# Patient Record
Sex: Female | Born: 1970 | Race: White | Hispanic: No | Marital: Married | Smoking: Never smoker
Health system: Southern US, Community
[De-identification: ages and names within clinical notes are randomized; demographics above are authoritative.]

## PROBLEM LIST (undated history)

## (undated) DIAGNOSIS — E079 Disorder of thyroid, unspecified: Secondary | ICD-10-CM

## (undated) DIAGNOSIS — K589 Irritable bowel syndrome without diarrhea: Secondary | ICD-10-CM

## (undated) DIAGNOSIS — E559 Vitamin D deficiency, unspecified: Secondary | ICD-10-CM

## (undated) HISTORY — DX: Irritable bowel syndrome, unspecified: K58.9

## (undated) HISTORY — PX: ABDOMINAL HYSTERECTOMY: SHX81

## (undated) HISTORY — DX: Vitamin D deficiency, unspecified: E55.9

---

## 1997-10-28 ENCOUNTER — Other Ambulatory Visit: Admission: RE | Admit: 1997-10-28 | Discharge: 1997-10-28 | Payer: Self-pay | Admitting: Obstetrics and Gynecology

## 1998-12-04 ENCOUNTER — Other Ambulatory Visit: Admission: RE | Admit: 1998-12-04 | Discharge: 1998-12-04 | Payer: Self-pay | Admitting: Obstetrics and Gynecology

## 1999-02-08 ENCOUNTER — Inpatient Hospital Stay (HOSPITAL_COMMUNITY): Admission: AD | Admit: 1999-02-08 | Discharge: 1999-02-08 | Payer: Self-pay | Admitting: *Deleted

## 1999-06-16 ENCOUNTER — Ambulatory Visit: Admission: RE | Admit: 1999-06-16 | Discharge: 1999-06-16 | Payer: Self-pay | Admitting: Obstetrics & Gynecology

## 1999-06-16 ENCOUNTER — Encounter: Payer: Self-pay | Admitting: Obstetrics & Gynecology

## 1999-08-14 ENCOUNTER — Inpatient Hospital Stay (HOSPITAL_COMMUNITY): Admission: AD | Admit: 1999-08-14 | Discharge: 1999-08-16 | Payer: Self-pay | Admitting: Obstetrics & Gynecology

## 1999-09-29 ENCOUNTER — Other Ambulatory Visit: Admission: RE | Admit: 1999-09-29 | Discharge: 1999-09-29 | Payer: Self-pay | Admitting: Obstetrics and Gynecology

## 2000-10-04 ENCOUNTER — Other Ambulatory Visit: Admission: RE | Admit: 2000-10-04 | Discharge: 2000-10-04 | Payer: Self-pay | Admitting: Obstetrics and Gynecology

## 2001-10-25 ENCOUNTER — Other Ambulatory Visit: Admission: RE | Admit: 2001-10-25 | Discharge: 2001-10-25 | Payer: Self-pay | Admitting: Obstetrics & Gynecology

## 2002-06-22 ENCOUNTER — Ambulatory Visit (HOSPITAL_COMMUNITY): Admission: RE | Admit: 2002-06-22 | Discharge: 2002-06-22 | Payer: Self-pay

## 2002-06-22 ENCOUNTER — Encounter: Payer: Self-pay | Admitting: Obstetrics & Gynecology

## 2002-10-10 ENCOUNTER — Other Ambulatory Visit: Admission: RE | Admit: 2002-10-10 | Discharge: 2002-10-10 | Payer: Self-pay | Admitting: Obstetrics & Gynecology

## 2002-10-10 ENCOUNTER — Other Ambulatory Visit: Admission: RE | Admit: 2002-10-10 | Discharge: 2002-10-10 | Payer: Self-pay | Admitting: Obstetrics and Gynecology

## 2003-01-02 ENCOUNTER — Ambulatory Visit (HOSPITAL_COMMUNITY): Admission: RE | Admit: 2003-01-02 | Discharge: 2003-01-02 | Payer: Self-pay | Admitting: Obstetrics & Gynecology

## 2003-10-09 ENCOUNTER — Other Ambulatory Visit: Admission: RE | Admit: 2003-10-09 | Discharge: 2003-10-09 | Payer: Self-pay | Admitting: Obstetrics and Gynecology

## 2004-05-22 ENCOUNTER — Encounter (INDEPENDENT_AMBULATORY_CARE_PROVIDER_SITE_OTHER): Payer: Self-pay | Admitting: *Deleted

## 2004-05-22 ENCOUNTER — Observation Stay (HOSPITAL_COMMUNITY): Admission: RE | Admit: 2004-05-22 | Discharge: 2004-05-23 | Payer: Self-pay | Admitting: Obstetrics & Gynecology

## 2004-06-18 ENCOUNTER — Encounter (INDEPENDENT_AMBULATORY_CARE_PROVIDER_SITE_OTHER): Payer: Self-pay | Admitting: *Deleted

## 2004-06-18 ENCOUNTER — Observation Stay (HOSPITAL_COMMUNITY): Admission: RE | Admit: 2004-06-18 | Discharge: 2004-06-19 | Payer: Self-pay | Admitting: Obstetrics & Gynecology

## 2009-05-30 ENCOUNTER — Encounter: Admission: RE | Admit: 2009-05-30 | Discharge: 2009-05-30 | Payer: Self-pay | Admitting: Obstetrics & Gynecology

## 2010-06-05 NOTE — Op Note (Signed)
NAME:  Katherine Castro, Katherine Castro NO.:  [id number]   MEDICAL RECORD NO.:  [id number]                   PATIENT TYPE:  AMB   LOCATION:  SDC                                  FACILITY:  [facility]   PHYSICIAN:  Freddy Finner, M.D.                DATE OF BIRTH:  08-04-1970   DATE OF PROCEDURE:  01/02/2003  DATE OF DISCHARGE:                                 OPERATIVE REPORT   PREOPERATIVE DIAGNOSES:  1. Voluntary sterilization.  2. Multiparity.   POSTOPERATIVE DIAGNOSES:  1. Voluntary sterilization.  2. Multiparity.   OPERATIVE PROCEDURE:  Laparoscopic tubal sterilization with placement of  Filshie clips.  No intra-abdominal or pelvic abnormalities noted.  Photographs were taken for the office record.   SURGEON:  Freddy Finner, M.D.   ANESTHESIA:  General.   INTRAOPERATIVE COMPLICATIONS:  None.   ESTIMATED INTRAOPERATIVE BLOOD LOSS:  5 mL.   The patient is a 40 year old with two living children, who has requested  permanent surgical sterilization.  She is admitted at this time for that  purpose.   She is brought to the operating room and placed under adequate general  endotracheal anesthesia, placed in the dorsal lithotomy position using the  [employer] stirrup system.  Betadine prep of the abdomen, perineum, and vagina  was carried out in the usual fashion, the bladder was evacuated using a  sterile Robinson catheter.  A Hulka tenaculum was attached to the cervix  under direct visualization.  Sterile drapes were applied.  A small  infraumbilical skin incision was made.  The anterior abdominal wall was  elevated manually and an 11 mm nonbloody disposable trocar introduced at the  umbilicus.  Direct inspection revealed adequate placement with no evidence  of injury on entry.  Pneumoperitoneum was then allowed to accumulate with  carbon dioxide gas.  Scouting inspection of the abdomen and pelvis was  carried out with no obvious abnormal findings.  Appendix was noted  to be  normal.  All pelvic organs were normal.  The Filshie clip device was  backloaded through the operating channel of the laparoscope and a Filshie  clip placed onto each fallopian tube at the isthmic portion without  difficulty without intra-abdominal bleeding.  At this point the procedure  was terminated, gas was allowed to escape from the abdomen. Marcaine 0.25%  plain was injected into the incision site for postoperative analgesia, and  the patient was given 30 mg of Toradol IV and 30 mg  IM for postoperative pain relief.  The incision was closed with interrupted  subcuticular sutures of 3-0 Dexon. The patient tolerated the procedure well  and was taken to recovery in good condition.  She will be given routine  outpatient surgical instructions for follow-up in the office in two weeks.  Freddy Finner, M.D.    WRN/MEDQ  D:  01/02/2003  T:  01/02/2003  Job:  [id number]

## 2010-06-05 NOTE — Op Note (Signed)
NAME:  Katherine Castro, Katherine Castro            ACCOUNT NO.:  [id number]   MEDICAL RECORD NO.:  [id number]          PATIENT TYPE:  OBV   LOCATION:  9399                          FACILITY:  [facility]   PHYSICIAN:  Freddy Finner, M.D.   DATE OF BIRTH:  04/16/1970   DATE OF PROCEDURE:  06/18/2004  DATE OF DISCHARGE:                                 OPERATIVE REPORT   PREOPERATIVE DIAGNOSIS:  Uterine adenomyosis, clinical symptoms of severe  menorrhagia and dysmenorrhea.   POSTOPERATIVE DIAGNOSIS:  Uterine adenomyosis, clinical symptoms of severe  menorrhagia and dysmenorrhea.   PROCEDURE:  Laparoscopically-assisted vaginal hysterectomy.   SURGEON:  Freddy Finner, M.D.   ASSISTANT:  Raynald Kemp, M.D.   ESTIMATED BLOOD LOSS:  150 mL.   COMPLICATIONS:  None.   DESCRIPTION OF PROCEDURE:  The patient is a 40 year old admitted on the  morning of surgery.  She received a bolus of Rocephin IV.  She was placed in  PAS hose.  She was brought to the operating room and placed under adequate  general endotracheal anesthesia, and placed in the dorsal lithotomy using  the [facility] stirrup system.  Betadine prep using scrub followed by solution  was carried out for abdomen, perineum, and vagina.  Bladder was evacuated  with a Robinson catheter.  Cervix was visualized and a Hulka tenaculum  placed on the cervix without difficulty.  Sterile drapes were applied.  Two  small incisions were made in the abdomen, one at the umbilicus, one just  above the symphysis.  An 11 mm nonbladed disposable trocar was introduced in  the umbilicus while elevating the anterior abdominal wall manually.  Direct  inspection revealed adequate placement with no evidence of injury on entry.  The pneumoperitoneum was allowed to accumulate with carbon dioxide gas.  5  mm trocar was placed through a lower incision.  Through it, a blunt probe  was used for retraction and exposure during the procedure.  Systematic  examination of pelvic and  abdominal contents was carried up.  Upper abdomen  was normal.  Appendix was normal.  Tubes and ovaries were normal.  Uterus  was slightly boggy and irregular in contour and appearance.  There were no  pelvic peritoneal lesions.  There were Filshie clips on the fallopian tubes.  Using Gyrus tripolar device, the fallopian tube, round ligament, utero-  ovarian ligament, and upper broad ligament on each side was progressively  developed into pedicles which were sealed and divided using the Gyrus  device.  The dissection was carried to a level just above the uterine  arteries.  Attention was turned vaginally.  Posterior weighted vaginal  retractor was placed.  Deaver retractors were placed anterior and laterally.  Hulka tenaculum was replaced with a Jacobs tenaculum.  Colpotomy incision  was made while tenting the mucosa posterior to the cervix with an Allis.  Cervix was circumscribed with a scalpel to release the mucosa.  The Gyrus  Heaney style clamp was then used to seal and divide the uterosacrals and  bladder pillars on each side.  The bladder was carefully advanced off the  cervix.  Anterior  peritoneum was entered.  The Gyrus device was used to seal  and divided the uterosacral pedicles and the uterine artery pedicles.  The  uterus was delivered through the introitus and one small remaining pedicle  on the right was sealed and divided.  Angles of the vagina were anchored to  uterosacrals with mattress sutures of 0 Monocryl.  Scant amount of oozing  just above the uterosacrals on each side on the cardinal ligament pedicles  was resealed with the Gyrus device.  The uterosacrals were plicated and the  posterior peritoneum closed with an interrupted 0 Monocryl suture.  Cuff was  closed vertically with figure-of-eights of 0 Monocryl.  Foley catheter was  placed.  Reinspection laparoscopically was carried out.  Nezhat probe was  used to irrigate and examine the pelvic pedicles and remove all of  the blood  that was still present.  Scant oozing sites on the bladder flap and on the  uterosacrals were sealed with the Gyrus device.  Hemostasis was complete.  All irrigating solution was aspirated from the abdomen.  Gas was allowed to  escape.  Instruments were removed.  Incisions were closed with interrupted  subcuticular sutures of 3-0 Dexon.  0.5% plain Marcaine was injected into  the incision sites for postoperative analgesia.  Steri-Strips were applied  to the lower incision.  The patient was then awakened and taken to the  recovery room in good condition.      WRN/MEDQ  D:  06/18/2004  T:  06/18/2004  Job:  [id number]

## 2010-06-05 NOTE — Discharge Summary (Signed)
NAMEMarland Kitchen  Katherine Castro, Katherine Castro            ACCOUNT NO.:  [id number]   MEDICAL RECORD NO.:  [id number]          PATIENT TYPE:  OBV   LOCATION:  9307                          [facility]ACILITY:  [facility]   PHYSICIAN:  Freddy Finner, M.D.   DATE OF BIRTH:  [birth year]   DATE OF ADMISSION:  06/18/2004  DATE OF DISCHARGE:  06/19/2004                                 DISCHARGE SUMMARY   DISCHARGE DIAGNOSIS:  Uterine adenomyosis.   CLINICAL SYMPTOMS:  Severe menorrhagia, dysmenorrhea.   OPERATIVE PROCEDURE:  Laparoscopic assisted vaginal hysterectomy.   SURGEON:  Dr. Jennette Kettle, assistant ______. Intraoperative and postoperative  complications none.   DISPOSITION:  The patient is in satisfactory condition at the time of her  discharge. She is to have progressive increasing physical activity; no  vaginal entry of heavy lifting. She is to take Percocet as needed for pain.  She is to follow up in the office in approximately 2 weeks. She is to call  for fevers, severe pain or heavy bleeding.   Details of the present illness are recorded in the admission note. Physical  findings were remarkable for mild enlargement of the uterus and ultrasound  findings consistent with adenomyosis.   The patient was admitted on the morning of surgery. The above laparoscopic  surgery was accomplished without difficulty. There were no postoperative  complications. By the evening of the first postoperative day she was  ambulating without difficulty, having adequate bowel and bladder function.  She was discharged home with disposition as noted above.      WRN/MEDQ  D:  06/19/2004  T:  06/20/2004  Job:  [id number]

## 2010-06-05 NOTE — Op Note (Signed)
NAMEMarland Castro  KEISA, BLOW            ACCOUNT NO.:  [id number]   MEDICAL RECORD NO.:  [id number]          PATIENT TYPE:  OBV   LOCATION:  9319                          [facility]ACILITY:  [facility]   PHYSICIAN:  Freddy Finner, M.D.   DATE OF BIRTH:  1970-05-21   DATE OF PROCEDURE:  05/22/2004  DATE OF DISCHARGE:                                 OPERATIVE REPORT   PREOPERATIVE DIAGNOSES:  1.  Uterine enlargement.  2.  Suspected adenomyosis.   POSTOPERATIVE DIAGNOSES:  1.  Uterine enlargement.  2.  Suspected adenomyosis.   OPERATIVE PROCEDURE:  Hysteroscopy D&C followed by attempted NovaSure  ablation of endometrium with complication of uterine perforation in upper  left fundus of uterus precluding the NovaSure ablation.   ANESTHESIA:  General.   ESTIMATED INTRAOPERATIVE BLOOD LOSS:  20 mL or less.   LACTATED RINGER'S DEFICIT:  210 mL.   OTHER INTRAOPERATIVE COMPLICATIONS:  None.   The patient is a 40 year old who has been known to have menorrhagia and was  found to have uterine enlargement with findings on sonohysterogram  consistent with adenomyosis. She does not wish to proceed with hysterectomy  at the current time and has elected to proceed with a more minor procedure  even though she understands that this may not resolve her menorrhagia. The  greater risk of failure with adenomyosis was discussed at length with her  before the procedure.   She was admitted on the morning of surgery. She was given a 1-gram bolus of  Rocephin IV preoperatively. She was brought to the operating room, there  placed under adequate general anesthesia, placed in the dorsal lithotomy  position using the [place] stirrup system. Betadine prep was carried out in  the usual fashion. The cervix was visualized with a bivalve speculum. The  cervix was grasped on the anterior lip with a single tooth tenaculum. Cervix  sounded to 2.5 cm. Uterus sounded to 10.5 cm. The cervix was progressively  dilated with Hegar  dilators to #23. The 12.5-degree [facility] hysteroscope was  introduced using lactated Ringer's as a distending medium. The cavity  appeared to be enlarged but was otherwise normal. Photographs were made and  retained in the office record. Gentle thorough curettage was carried out  using a Heaney curette and exploration with Randall stone forceps was  performed to collect tissue for histologic examination. The NovaSure device  was then loaded without what appeared to be any difficulty whatsoever but an  adequate seal could not be achieved using various maneuvers including  reapplication of the device and a second tenaculum on the cervix and  Vaseline gauze to occlude the os. Based on this it was elected to repeat the  hysteroscopy and on doing so a perforation could easily be seen in the upper  left  posterior fundus. Photographs of this were also made. The procedure  therefore was terminated. The patient will be admitted for observation. No  other complications were experienced during the procedure. The patient's  vital signs remained stable.      WRN/MEDQ  D:  05/22/2004  T:  05/22/2004  Job:  [id number]

## 2010-06-05 NOTE — H&P (Signed)
NAME:  Katherine Castro, Katherine Castro            ACCOUNT NO.:  [id number]   MEDICAL RECORD NO.:  [id number]          PATIENT TYPE:  AMB   LOCATION:  SDC                           [facility]ACILITY:  [facility]   PHYSICIAN:  Freddy Finner, M.D.   DATE OF BIRTH:  February 03, 1970   DATE OF ADMISSION:  06/18/2004  DATE OF DISCHARGE:                                HISTORY & PHYSICAL   ADMISSION DIAGNOSES:  1.  Uterine adenomyosis.  2.  Clinical symptoms of severe menorrhagia and dysmenorrhea.   HISTORY OF PRESENT ILLNESS:  The patient is a 40 year old white married  female, gravida 2, para 2, delivery via vaginal route, who had tubal  sterilization in September 2005, with no apparent pelvic pathology.  She has  subsequent had a regular, very heavy and painful menses which have continued  to the present time.  She had sonohysterogram in the office in April 2006  showing no intracavitary abnormality but thickening of the myometrial wall.  Subsequent to that procedure she was scheduled for hysteroscopy/D&C with  NovaSure endometrial ablation.  The possibility of failure had been  discussed with the patient because of the presence of adenomyosis.  The D&C  hysteroscopy was successful but the NovaSure device perforated the uterine  fundus and precluded endometrial ablation.  This was presumed secondary to  softening of the myometrium due to adenomyosis.  That perforation resulted  in no significant complications, and she was observed overnight in the  hospital.  The patient has requested definitive surgical intervention for  her menstrual problems, specifically now requesting hysterectomy since the  NovaSure procedure was not successful.  She is admitted now for laparoscopic  assisted vaginal hysterectomy.   REVIEW OF SYSTEMS:  Her current review of systems is negative.  There are no  cardiopulmonary, GI or GU complaints.   PAST MEDICAL HISTORY:  The patient has no significant medical illnesses.   MEDICATIONS:  She is  currently on no medications on a chronic basis except  for oral contraceptives which she has tried for control of menorrhagia.   PAST GYNECOLOGIC HISTORY:  She does have a history of abnormal Pap with  cryotherapy in March 1998. Pap smears subsequently have been normal.   ALLERGIES:  She does not have allergies to medications though Percocet makes  her dizzy.  She does not use cigarettes. She has never had a blood  transfusion.   FAMILY HISTORY:  Noncontributory.   PHYSICAL EXAMINATION:  VITAL SIGNS:  Blood pressure in the office is 110/70.  HEENT:  Grossly within normal limits.  NECK:  The thyroid gland is not palpably enlarged.  CHEST:  Clear to auscultation.  HEART:  Normal sinus rhythm without murmurs, rubs or gallops.  BREASTS:  Exam is normal.  No palpable masses.  No skin change or nipple  discharge.  ABDOMEN:  Soft and nontender.  There is no appreciable organomegaly or  palpable masses.  PELVIC:  External genitalia, vagina and cervix are normal to inspection.  Bimanual reveals the uterus to be anterior in position, perhaps slightly  enlarged but not very dramatically so.  No palpable adnexal masses.  The  rectum is palpably normal.  Rectovaginal exam confirms the above findings.   ASSESSMENT:  1.  Uterine adenomyosis with menometrorrhagia.  2.  Severe dysmenorrhea.   PLAN:  Laparoscopically-assisted vaginal hysterectomy.      WRN/MEDQ  D:  06/17/2004  T:  06/17/2004  Job:  [id number]

## 2010-06-05 NOTE — Discharge Summary (Signed)
NAMEMarland Castro  SHADIE, SWEATMAN            ACCOUNT NO.:  [id number]   MEDICAL RECORD NO.:  [id number]          PATIENT TYPE:  OBV   LOCATION:  9319                          [facility]ACILITY:  [facility]   PHYSICIAN:  Freddy Finner, M.D.   DATE OF BIRTH:  1970-02-04   DATE OF ADMISSION:  05/22/2004  DATE OF DISCHARGE:  05/23/2004                                 DISCHARGE SUMMARY   DISCHARGE DIAGNOSIS:  Uterine enlargement, probable uterine adenomyosis.   OPERATIVE PROCEDURE:  Hysteroscopy, D&C, attempted NovaSure endometrial  ablation with intraoperative complication of uterine perforation.  No other  complications.   DISPOSITION:  The patient is stable and in good condition at the time of her  discharge.  Her hemoglobin has remained stable.  Her abdomen is soft and non-  tender.  She is tolerating a regular diet.  She is ambulating without  difficulty.  There have been no apparent sequelae from the uterine  perforation.  She is discharged home with Darvocet as needed for pain.  She  is to followup in the office in approximately 10 days.  She is to call for  fevers, severe pain or heavy bleeding or any evidence of abdominal pain or  syncopal episodes.   The patient was admitted as an outpatient for hysteroscopy, D&C, and  endometrial ablation.  During the procedure, uterine perforation was  encountered with the NovaSure device and for that reason, the endometrial  ablation could not be accomplished.  Because of the perforation, the patient  was admitted for observation.   PHYSICAL FINDINGS:  On admission, were remarkable only for enlargement of  the uterus and her clinical history was the most significant thing with very  heavy, painful periods.   LABORATORY DATA:  During this admission includes hemoglobin preoperatively  of 13.6, postoperatively on the afternoon of surgery, 12.2, and on the  morning of discharge 12.1.  Prothrombin time and PTT on admission were  normal.   HOSPITAL COURSE:  The  patient was admitted for outpatient surgery as noted  above.  The above described procedure was accomplished with the complication  of uterine perforation.  The patient was admitted overnight for observation.  Her vital signs remained stable.  Her hemoglobin was stable.  She was in  excellent condition on the morning after surgery and was discharged home  with disposition as noted above.      WRN/MEDQ  D:  05/23/2004  T:  05/24/2004  Job:  [id number]

## 2014-06-24 ENCOUNTER — Other Ambulatory Visit: Payer: Self-pay | Admitting: Obstetrics and Gynecology

## 2014-06-25 LAB — CYTOLOGY - PAP

## 2015-10-02 DIAGNOSIS — E039 Hypothyroidism, unspecified: Secondary | ICD-10-CM | POA: Insufficient documentation

## 2016-05-14 ENCOUNTER — Encounter (HOSPITAL_COMMUNITY): Payer: Self-pay | Admitting: *Deleted

## 2016-05-14 ENCOUNTER — Emergency Department (HOSPITAL_COMMUNITY)
Admission: EM | Admit: 2016-05-14 | Discharge: 2016-05-14 | Disposition: A | Discharge status: Home / Self Care | Payer: PRIVATE HEALTH INSURANCE | Attending: Emergency Medicine | Admitting: Emergency Medicine

## 2016-05-14 DIAGNOSIS — S0125XA Open bite of nose, initial encounter: Secondary | ICD-10-CM | POA: Diagnosis present

## 2016-05-14 DIAGNOSIS — S01411A Laceration without foreign body of right cheek and temporomandibular area, initial encounter: Secondary | ICD-10-CM | POA: Insufficient documentation

## 2016-05-14 DIAGNOSIS — Y939 Activity, unspecified: Secondary | ICD-10-CM | POA: Insufficient documentation

## 2016-05-14 DIAGNOSIS — Y929 Unspecified place or not applicable: Secondary | ICD-10-CM | POA: Diagnosis not present

## 2016-05-14 DIAGNOSIS — T07XXXA Unspecified multiple injuries, initial encounter: Secondary | ICD-10-CM

## 2016-05-14 DIAGNOSIS — W540XXA Bitten by dog, initial encounter: Secondary | ICD-10-CM | POA: Insufficient documentation

## 2016-05-14 DIAGNOSIS — Y999 Unspecified external cause status: Secondary | ICD-10-CM | POA: Diagnosis not present

## 2016-05-14 DIAGNOSIS — S0185XA Open bite of other part of head, initial encounter: Secondary | ICD-10-CM

## 2016-05-14 HISTORY — DX: Disorder of thyroid, unspecified: E07.9

## 2016-05-14 MED ORDER — CLINDAMYCIN HCL 150 MG PO CAPS
300.0000 mg | ORAL_CAPSULE | Freq: Once | ORAL | Status: AC
  Administered 2016-05-14: 300 mg via ORAL
  Filled 2016-05-14: qty 2

## 2016-05-14 MED ORDER — LIDOCAINE HCL (PF) 1 % IJ SOLN
5.0000 mL | Freq: Once | INTRAMUSCULAR | Status: AC
  Administered 2016-05-14: 5 mL
  Filled 2016-05-14: qty 5

## 2016-05-14 MED ORDER — CLINDAMYCIN HCL 300 MG PO CAPS: 300.0000 mg | ORAL_CAPSULE | Freq: Three times a day (TID) | ORAL | 0 refills | Status: DC

## 2016-05-14 MED ORDER — BUPIVACAINE HCL 0.25 % IJ SOLN
5.0000 mL | Freq: Once | INTRAMUSCULAR | Status: AC
  Administered 2016-05-14: 5 mL
  Filled 2016-05-14 (×2): qty 5

## 2016-05-14 MED ORDER — TRAMADOL HCL 50 MG PO TABS: 50.0000 mg | ORAL_TABLET | Freq: Four times a day (QID) | ORAL | 0 refills | Status: DC | PRN

## 2016-05-14 NOTE — ED Notes (Signed)
Pt verbalized understanding of d/c instructions and has no further questions. Pt stable and NAD. VSS. Pt given non adherent dressings and supplies to dress wound.

## 2016-05-14 NOTE — ED Provider Notes (Signed)
[facility] DEPT Provider Note   CSN: [id number] Arrival date & time: 05/14/16  1808   By signing my name below, I, Evelene Croon, attest that this documentation has been prepared under the direction and in the presence of Virgel Manifold, MD . Electronically Signed: Evelene Croon, Scribe. 05/14/2016. 7:31 PM.  History   Chief Complaint Chief Complaint  Patient presents with  . Animal Bite    The history is provided by the patient. No language interpreter was used.     HPI Comments:  Katherine Castro is a 46 y.o. female who presents to the Emergency Department complaining of an animal bite to the nose that she sustained just PTA. Pt states she bent down next to her friend's dog and when she went to pet the dog it bit her. Tetanus is UTD (July 2016). Pt has no other acute complaints or associated symptoms at this time.    Past Medical History:  Diagnosis Date  . Thyroid disease     There are no active problems to display for this patient.   History reviewed. No pertinent surgical history.  OB History    No data available       Home Medications    Prior to Admission medications   Not on File    Family History History reviewed. No pertinent family history.  Social History Social History  Substance Use Topics  . Smoking status: Never Smoker  . Smokeless tobacco: Not on file  . Alcohol use No     Allergies   Penicillins   Review of Systems Review of Systems  Constitutional: Negative for chills and fever.  Respiratory: Negative for shortness of breath.   Cardiovascular: Negative for chest pain.  Skin: Positive for wound.  All other systems reviewed and are negative.    Physical Exam Updated Vital Signs BP 136/89   Pulse 81   Temp 97.7 F (36.5 C) (Oral)   Resp 18   Wt 154 lb (69.9 kg)   SpO2 100%   Physical Exam  Constitutional: She is oriented to person, place, and time. She appears well-developed and well-nourished. No distress.   HENT:  Head: Normocephalic and atraumatic.    Eyes: EOM are normal.  Neck: Normal range of motion.  Cardiovascular: Normal rate.   Pulmonary/Chest: Effort normal.  Musculoskeletal: Normal range of motion.  Neurological: She is alert and oriented to person, place, and time.  Skin: Skin is warm and dry.  Extensive laceration from bridge of nose to the apex as pictured.  Nasal septum is visible but appears intact. Wound does not appear to extend intranasally.  No significant bleeding  Three 1 cm lacerations noted to right cheek. The most lateral two are roughly linear while the most medial laceration is stellate   Psychiatric: She has a normal mood and affect. Judgment normal.  Nursing note and vitals reviewed.         ED Treatments / Results  DIAGNOSTIC STUDIES:  Oxygen Saturation is 100% on RA, normal by my interpretation.    COORDINATION OF CARE:  7:00 PM Discussed treatment plan with pt at bedside and pt agreed to plan.  Labs (all labs ordered are listed, but only abnormal results are displayed) Labs Reviewed - No data to display  EKG  EKG Interpretation None       Radiology No results found.  Procedures .Marland KitchenLaceration Repair Date/Time: 05/14/2016 10:19 PM Performed by: Virgel Manifold Authorized by: Virgel Manifold   Consent:  Consent obtained:  Verbal   Consent given by:  Patient Anesthesia (see MAR for exact dosages):    Anesthesia method:  Local infiltration   Local anesthetic:  Lidocaine 2% w/o epi and bupivacaine 0.25% w/o epi Laceration details:    Location:  Face   Face location:  Nose   Length (cm):  4 Repair type:    Repair type:  Complex Exploration:    Wound exploration: wound explored through full range of motion and entire depth of wound probed and visualized   Treatment:    Area cleansed with:  Saline   Amount of cleaning:  Extensive   Irrigation solution:  Sterile saline   Visualized foreign bodies/material removed: no      Debridement:  None Skin repair:    Repair method:  Sutures   Suture size:  6-0   Wound skin closure material used: Ethilon.   Suture technique:  Simple interrupted Post-procedure details:    Dressing:  Antibiotic ointment   Patient tolerance of procedure:  Tolerated well, no immediate complications Comments:     Primarily simple interrupted sutures. Corner stitch in flap towards the bridge of the nose. This flap was quite thin but viable.  Marland Kitchen.Laceration Repair Date/Time: 05/14/2016 10:48 PM Performed by: Virgel Manifold Authorized by: Virgel Manifold   Consent:    Consent obtained:  Verbal   Consent given by:  Patient Anesthesia (see MAR for exact dosages):    Anesthesia method:  Local infiltration   Local anesthetic:  Bupivacaine 0.25% w/o epi and lidocaine 2% w/o epi Laceration details:    Location:  Face   Face location:  R cheek   Length (cm):  1 Treatment:    Area cleansed with:  Saline Skin repair:    Repair method:  Sutures   Suture size:  6-0   Wound skin closure material used: ethilon.   Suture technique:  Simple interrupted Post-procedure details:    Dressing:  Antibiotic ointment   Patient tolerance of procedure:  Tolerated well, no immediate complications .Marland KitchenLaceration Repair Date/Time: 05/14/2016 10:48 PM Performed by: Virgel Manifold Authorized by: Virgel Manifold   Consent:    Consent obtained:  Verbal   Consent given by:  Patient Anesthesia (see MAR for exact dosages):    Anesthesia method:  Local infiltration   Local anesthetic:  Bupivacaine 0.25% w/o epi and lidocaine 2% w/o epi Laceration details:    Location:  Face   Face location:  R cheek   Length (cm):  1 Treatment:    Area cleansed with:  Saline Skin repair:    Repair method:  Sutures   Suture size:  6-0 (ethilon)   Suture technique:  Simple interrupted Post-procedure details:    Dressing:  Antibiotic ointment   Patient tolerance of procedure:  Tolerated well, no immediate  complications .Marland KitchenLaceration Repair Date/Time: 05/14/2016 10:48 PM Performed by: Virgel Manifold Authorized by: Virgel Manifold   Consent:    Consent obtained:  Verbal   Consent given by:  Patient Anesthesia (see MAR for exact dosages):    Anesthesia method:  Local infiltration   Local anesthetic:  Bupivacaine 0.25% w/o epi and lidocaine 2% w/o epi Laceration details:    Location:  Face   Face location:  R cheek   Length (cm):  1 Repair type:    Repair type:  Simple Treatment:    Area cleansed with:  Saline Skin repair:    Repair method:  Sutures   Suture size:  6-0   Wound skin closure material  used: ethilon.   Suture technique:  Simple interrupted Post-procedure details:    Dressing:  Antibiotic ointment   Patient tolerance of procedure:  Tolerated well, no immediate complications   (including critical care time)  Medications Ordered in ED Medications  bupivacaine (MARCAINE) 0.25 % (with pres) injection 5 mL (5 mLs Infiltration Given 05/14/16 1934)  lidocaine (PF) (XYLOCAINE) 1 % injection 5 mL (5 mLs Other Given 05/14/16 1934)  clindamycin (CLEOCIN) capsule 300 mg (300 mg Oral Given 05/14/16 1934)     Initial Impression / Assessment and Plan / ED Course  I have reviewed the triage vital signs and the nursing notes.  Pertinent labs & imaging results that were available during my care of the patient were reviewed by me and considered in my medical decision making (see chart for details).  7:31 PM Discussed case with Dr. Marla Roe (plastics). Sent picture that is included in chart. Advised to close and will follow-up in office.   45yF with dog bite to face. Multiple wounds, but most significant to nose. All copiously irrigated prior to closure. PCN allergy. Placed on clindamycin. Tetanus current. Continued wound care and return precautions discussed. Plastic surgery FU.Marland Kitchen   Final Clinical Impressions(s) / ED Diagnoses   Final diagnoses:  Dog bite of face, initial encounter   Multiple lacerations    New Prescriptions New Prescriptions   No medications on file   I personally preformed the services scribed in my presence. The recorded information has been reviewed is accurate. Virgel Manifold, MD.     Virgel Manifold, MD 05/15/16 [phone]

## 2016-06-13 DIAGNOSIS — A0472 Enterocolitis due to Clostridium difficile, not specified as recurrent: Secondary | ICD-10-CM | POA: Insufficient documentation

## 2016-06-13 DIAGNOSIS — Z79899 Other long term (current) drug therapy: Secondary | ICD-10-CM | POA: Diagnosis not present

## 2016-06-13 DIAGNOSIS — R197 Diarrhea, unspecified: Secondary | ICD-10-CM | POA: Diagnosis present

## 2016-06-14 ENCOUNTER — Encounter (HOSPITAL_COMMUNITY): Payer: Self-pay | Admitting: Nurse Practitioner

## 2016-06-14 ENCOUNTER — Emergency Department (HOSPITAL_COMMUNITY)
Admission: EM | Admit: 2016-06-14 | Discharge: 2016-06-14 | Disposition: A | Discharge status: Home / Self Care | Payer: PRIVATE HEALTH INSURANCE | Attending: Emergency Medicine | Admitting: Emergency Medicine

## 2016-06-14 DIAGNOSIS — A0472 Enterocolitis due to Clostridium difficile, not specified as recurrent: Secondary | ICD-10-CM

## 2016-06-14 LAB — COMPREHENSIVE METABOLIC PANEL
ALK PHOS: 75 U/L (ref 38–126)
ALT: 12 U/L — ABNORMAL LOW (ref 14–54)
AST: 17 U/L (ref 15–41)
Albumin: 4 g/dL (ref 3.5–5.0)
Anion gap: 9 (ref 5–15)
BILIRUBIN TOTAL: 0.4 mg/dL (ref 0.3–1.2)
BUN: 7 mg/dL (ref 6–20)
CALCIUM: 9.2 mg/dL (ref 8.9–10.3)
CO2: 26 mmol/L (ref 22–32)
CREATININE: 0.69 mg/dL (ref 0.44–1.00)
Chloride: 105 mmol/L (ref 101–111)
Glucose, Bld: 124 mg/dL — ABNORMAL HIGH (ref 65–99)
Potassium: 3.3 mmol/L — ABNORMAL LOW (ref 3.5–5.1)
SODIUM: 140 mmol/L (ref 135–145)
TOTAL PROTEIN: 7.3 g/dL (ref 6.5–8.1)

## 2016-06-14 LAB — URINALYSIS, ROUTINE W REFLEX MICROSCOPIC
Bacteria, UA: NONE SEEN
Bilirubin Urine: NEGATIVE
GLUCOSE, UA: NEGATIVE mg/dL
Hgb urine dipstick: NEGATIVE
KETONES UR: 5 mg/dL — AB
Nitrite: NEGATIVE
PH: 6 (ref 5.0–8.0)
Protein, ur: NEGATIVE mg/dL
RBC / HPF: NONE SEEN RBC/hpf (ref 0–5)
Specific Gravity, Urine: 1.006 (ref 1.005–1.030)

## 2016-06-14 LAB — CBC
HCT: 41 % (ref 36.0–46.0)
Hemoglobin: 13.9 g/dL (ref 12.0–15.0)
MCH: 28.5 pg (ref 26.0–34.0)
MCHC: 33.9 g/dL (ref 30.0–36.0)
MCV: 84 fL (ref 78.0–100.0)
PLATELETS: 296 10*3/uL (ref 150–400)
RBC: 4.88 MIL/uL (ref 3.87–5.11)
RDW: 13.1 % (ref 11.5–15.5)
WBC: 7.7 10*3/uL (ref 4.0–10.5)

## 2016-06-14 LAB — LIPASE, BLOOD: Lipase: 41 U/L (ref 11–51)

## 2016-06-14 MED ORDER — POTASSIUM CHLORIDE CRYS ER 20 MEQ PO TBCR
40.0000 meq | EXTENDED_RELEASE_TABLET | Freq: Once | ORAL | Status: AC
  Administered 2016-06-14: 40 meq via ORAL
  Filled 2016-06-14: qty 2

## 2016-06-14 MED ORDER — SODIUM CHLORIDE 0.9 % IV BOLUS (SEPSIS)
1000.0000 mL | Freq: Once | INTRAVENOUS | Status: AC
  Administered 2016-06-14: 1000 mL via INTRAVENOUS

## 2016-06-14 NOTE — ED Triage Notes (Signed)
Pt states she has been having diarrhea for the last 3 days, and each time she lays down her face feels tingly which concerned her and wants to be evlauted for it.

## 2016-06-14 NOTE — Discharge Instructions (Signed)
You may discontinue ciprofloxacin. Continue taking Flagyl 500 mg every 8 hours for a total of 10 days. We also advise the use of an over-the-counter probiotic. Be sure to remain well hydrated by drinking plenty of fluids. Follow-up with your primary care doctor for recheck of symptoms, to ensure that they resolved. You may return to the emergency department as needed for new or concerning symptoms.

## 2016-06-14 NOTE — ED Notes (Signed)
Just finish antibiotics, asked for stool sample by pt

## 2016-06-14 NOTE — ED Notes (Signed)
Stool sample collected if wanted by provider

## 2016-06-14 NOTE — ED Provider Notes (Signed)
[facility] DEPT Provider Note   CSN: [id number] Arrival date & time: 06/13/16  2345     History   Chief Complaint Chief Complaint  Patient presents with  . Diarrhea    HPI Katherine Castro is a 46 y.o. female.  46 year old female with a history of thyroid disease presents to the emergency department for persistent diarrhea. Symptoms began 4 days ago. She reports having multiple episodes of looser stool throughout the day. Symptoms initially associated with abdominal pain and cramping. She saw her primary care doctor on 06/10/2016 started her on ciprofloxacin and Flagyl. Since this time, abdominal pain and cramping has largely subsided. Patient continues to hydrate with water exposed with Gatorade. She has not had any nausea, vomiting, or fevers. No bloody diarrhea. She came to the emergency department today because she noticed some nonspecific tingling in her face when lying supine. This resolved when upright. Patient states, "I just don't feel well. I don't know if fluids would help me."   The history is provided by the patient. No language interpreter was used.    Past Medical History:  Diagnosis Date  . Thyroid disease     There are no active problems to display for this patient.   History reviewed. No pertinent surgical history.  OB History    No data available       Home Medications    Prior to Admission medications   Medication Sig Start Date End Date Taking? Authorizing Provider  ciprofloxacin (CIPRO) 500 MG tablet Take 500 mg by mouth 2 (two) times daily. 06/10/16  Yes [provider]  levothyroxine (SYNTHROID, LEVOTHROID) 75 MCG tablet Take 75 mcg by mouth every morning. 04/26/16  Yes [provider]  metroNIDAZOLE (FLAGYL) 500 MG tablet Take 500 mg by mouth 3 (three) times daily. 06/10/16  Yes [provider]  clindamycin (CLEOCIN) 300 MG capsule Take 1 capsule (300 mg total) by mouth 3 (three) times daily. Patient not taking:  Reported on 06/14/2016 05/14/16   Virgel Manifold, MD  traMADol (ULTRAM) 50 MG tablet Take 1 tablet (50 mg total) by mouth every 6 (six) hours as needed. Patient not taking: Reported on 06/14/2016 05/14/16   Virgel Manifold, MD    Family History No family history on file.  Social History Social History  Substance Use Topics  . Smoking status: Never Smoker  . Smokeless tobacco: Not on file  . Alcohol use No     Allergies   Penicillins   Review of Systems Review of Systems Ten systems reviewed and are negative for acute change, except as noted in the HPI.    Physical Exam Updated Vital Signs BP 122/73 (BP Location: Left Arm)   Pulse 91   Temp 98.1 F (36.7 C) (Oral)   Resp 16   SpO2 100%   Physical Exam  Constitutional: She is oriented to person, place, and time. She appears well-developed and well-nourished. No distress.  Nontoxic appearing and in no acute distress  HENT:  Head: Normocephalic and atraumatic.  Eyes: Conjunctivae and EOM are normal. No scleral icterus.  Neck: Normal range of motion.  Cardiovascular: Normal rate, regular rhythm and intact distal pulses.   Pulmonary/Chest: Effort normal. No respiratory distress. She has no wheezes. She has no rales.  Respirations even and unlabored  Abdominal: Soft. She exhibits no distension and no mass. There is no tenderness. There is no guarding.  Soft, nondistended, nontender abdomen.  Musculoskeletal: Normal range of motion.  Neurological: She is alert and oriented to  person, place, and time. She exhibits normal muscle tone. Coordination normal.  GCS 15. Ambulatory with steady gait  Skin: Skin is warm and dry. No rash noted. She is not diaphoretic. No erythema. No pallor.  Psychiatric: She has a normal mood and affect. Her behavior is normal.  Nursing note and vitals reviewed.    ED Treatments / Results  Labs (all labs ordered are listed, but only abnormal results are displayed) Labs Reviewed  COMPREHENSIVE  METABOLIC PANEL - Abnormal; Notable for the following:       Result Value   Potassium 3.3 (*)    Glucose, Bld 124 (*)    ALT 12 (*)    All other components within normal limits  URINALYSIS, ROUTINE W REFLEX MICROSCOPIC - Abnormal; Notable for the following:    Ketones, ur 5 (*)    Leukocytes, UA TRACE (*)    Squamous Epithelial / LPF 0-5 (*)    All other components within normal limits  LIPASE, BLOOD  CBC    EKG  EKG Interpretation None       Radiology No results found.  Procedures Procedures (including critical care time)  Medications Ordered in ED Medications  sodium chloride 0.9 % bolus 1,000 mL (0 mLs Intravenous Stopped 06/14/16 0438)  potassium chloride SA (K-DUR,KLOR-CON) CR tablet 40 mEq (40 mEq Oral Given 06/14/16 0332)     Initial Impression / Assessment and Plan / ED Course  I have reviewed the triage vital signs and the nursing notes.  Pertinent labs & imaging results that were available during my care of the patient were reviewed by me and considered in my medical decision making (see chart for details).     Patient presents for persistent diarrhea for the last 4 days. She also notes a nonspecific sensation of paresthesias in her face present only when supine. Patient saw her primary care doctor 4 days ago and was started on ciprofloxacin and Flagyl. She has had some mild improvement in her associated abdominal pain and cramping since starting ciprofloxacin and Flagyl.  Chart reviewed which shows a positive stool PCR for Clostridium difficile. Patient given supportive treatment with IV fluids. Laboratory workup is reassuring. Leukocytosis since outpatient visit has resolved. Vital signs stable. The patient has been counseled to discontinue ciprofloxacin. I have advised that she continue Flagyl 500 mg 3 times a day. Primary care follow-up advised and return precautions given. Patient discharged in stable condition with no unaddressed concerns.   Final Clinical  Impressions(s) / ED Diagnoses   Final diagnoses:  C. difficile colitis    New Prescriptions New Prescriptions   No medications on file     Antonietta Breach, Hershal Coria 06/14/16 [place], April, MD 06/14/16 [phone]

## 2016-06-14 NOTE — ED Notes (Signed)
Bed: [facility]1 Expected date:  Expected time:  Means of arrival:  Comments: 

## 2016-06-15 DIAGNOSIS — E559 Vitamin D deficiency, unspecified: Secondary | ICD-10-CM | POA: Insufficient documentation

## 2016-06-28 ENCOUNTER — Ambulatory Visit (INDEPENDENT_AMBULATORY_CARE_PROVIDER_SITE_OTHER): Payer: PRIVATE HEALTH INSURANCE | Admitting: Family Medicine

## 2016-06-28 ENCOUNTER — Encounter: Payer: Self-pay | Admitting: Family Medicine

## 2016-06-28 ENCOUNTER — Ambulatory Visit (INDEPENDENT_AMBULATORY_CARE_PROVIDER_SITE_OTHER): Payer: PRIVATE HEALTH INSURANCE

## 2016-06-28 VITALS — BP 116/89 | HR 96 | Temp 98.8°F | Ht 60.0 in | Wt 144.0 lb

## 2016-06-28 DIAGNOSIS — A0472 Enterocolitis due to Clostridium difficile, not specified as recurrent: Secondary | ICD-10-CM

## 2016-06-28 LAB — CBC WITH DIFFERENTIAL/PLATELET
BASOS ABS: 0 10*3/uL (ref 0.0–0.2)
Basos: 0 %
EOS (ABSOLUTE): 0.2 10*3/uL (ref 0.0–0.4)
EOS: 1 %
HEMATOCRIT: 42 % (ref 34.0–46.6)
HEMOGLOBIN: 14.3 g/dL (ref 11.1–15.9)
IMMATURE GRANS (ABS): 0 10*3/uL (ref 0.0–0.1)
Immature Granulocytes: 0 %
LYMPHS ABS: 1.7 10*3/uL (ref 0.7–3.1)
LYMPHS: 14 %
MCH: 28.5 pg (ref 26.6–33.0)
MCHC: 34 g/dL (ref 31.5–35.7)
MCV: 84 fL (ref 79–97)
Monocytes Absolute: 0.9 10*3/uL (ref 0.1–0.9)
Monocytes: 7 %
NEUTROS ABS: 9.5 10*3/uL — AB (ref 1.4–7.0)
Neutrophils: 78 %
Platelets: 300 10*3/uL (ref 150–379)
RBC: 5.01 x10E6/uL (ref 3.77–5.28)
RDW: 14.6 % (ref 12.3–15.4)
WBC: 12.2 10*3/uL — ABNORMAL HIGH (ref 3.4–10.8)

## 2016-06-28 MED ORDER — VANCOMYCIN HCL 125 MG PO CAPS
125.0000 mg | ORAL_CAPSULE | Freq: Four times a day (QID) | ORAL | 0 refills | Status: DC
Start: 2016-06-28 — End: 2016-10-26

## 2016-06-28 MED ORDER — VANCOMYCIN HCL 125 MG PO CAPS: 125.0000 mg | ORAL_CAPSULE | Freq: Four times a day (QID) | ORAL | 0 refills | Status: DC

## 2016-06-28 NOTE — Progress Notes (Signed)
   HPI  Patient presents today here to establish care.  Patient was diagnosed with Clostridium difficile colitis on 06/10/2016. She was treated with Flagyl and had transient improvement of her symptoms. After finishing metronidazole she had recurrence of symptoms first in quality of stool, now she is back to having 8 or more large stools per day. She states that at times it every hour.  Patient did not eat yesterday number to make it to the appointment today.  She denies fever. She has severe malaise and feels ill. Abdominal pain as moderate.  PMH: Thyroid disease Past surgical history hysterectomy Family history: Father with diabetes, hyperlipidemia, hypertension, PGM with diabetes also. Social history: Married, no alcohol or drugs ROS: Per HPI  Objective: BP 116/89   Pulse 96   Temp 98.8 F (37.1 C) (Oral)   Ht 5' (1.524 m)   Wt 144 lb (65.3 kg)   BMI 28.12 kg/m  Gen: NAD, alert, cooperative with exam HEENT: NCAT CV: RRR, good S1/S2, no murmur Resp: CTABL, no wheezes, non-labored Abd: Soft, moderate tenderness to palpation in left lower quadrant and mild tenderness throughout Ext: No edema, warm Neuro: Alert and oriented, No gross deficits  Assessment and plan:  # C. difficile colitis Discussed the patient at length, we have options of repeating metronidazole course or progressing to vancomycin oral. Her repeat testing while being treated with Flagyl was negative. Given severe symptoms and loose stools at times that her hourly I don't believe it's helpful to retest at this time for C. difficile. CBC Plain film of the abdomen to rule out megacolon or free air. By mouth vancomycin ordered      Orders Placed This Encounter  Procedures  . DG Abd 2 Views    Standing Status:   Future    Standing Expiration Date:   08/28/2017    Order Specific Question:   Reason for Exam (SYMPTOM  OR DIAGNOSIS REQUIRED)    Answer:   C diff colitis with abd pain    Order Specific  Question:   Is patient pregnant?    Answer:   No    Order Specific Question:   Preferred imaging location?    Answer:   Internal    Order Specific Question:   Radiology Contrast Protocol - do NOT remove file path    Answer:   \\[email or web address]  . CBC with Differential/Platelet    Meds ordered this encounter  Medications  . vancomycin (VANCOCIN) 125 MG capsule    Sig: Take 1 capsule (125 mg total) by mouth 4 (four) times daily.    Dispense:  40 capsule    Refill:  [place], MD [facility] 06/28/2016, 12:16 PM

## 2016-06-28 NOTE — Addendum Note (Signed)
Addended by: Shelbie Ammons on: 06/28/2016 01:11 PM   Modules accepted: Orders

## 2016-06-28 NOTE — Patient Instructions (Signed)
Great to see you!  We will work on re-starting the medications and see how you do.

## 2016-07-13 ENCOUNTER — Telehealth: Payer: Self-pay | Admitting: *Deleted

## 2016-07-13 NOTE — Telephone Encounter (Signed)
Discussed with daughter listed on ROI form.   Would continue to watch and wait. No need for repeat labs at this time. His stools become loose, mucousy, or abdominal pain develops again would consider repeating stool sample in starting oral vancomycin again.  Would also recommend GI referral at that time.   Laroy Apple, MD [facility] 07/13/2016, 5:27 PM

## 2016-07-13 NOTE — Telephone Encounter (Signed)
Patient states that her diarrhea has improved some- has improved with abx  and was going once a day but today she has went twice. Patient is not having any abdominal pain but is concerned. Has been able to eat some food but has stayed away from greasy food. Wanting to know if she needs to do another stool sample? Also wanting to know how long it might take for it to be "normal" again. Please advise.

## 2016-07-14 ENCOUNTER — Other Ambulatory Visit: Payer: Self-pay

## 2016-07-14 DIAGNOSIS — A0472 Enterocolitis due to Clostridium difficile, not specified as recurrent: Secondary | ICD-10-CM

## 2016-07-15 NOTE — Addendum Note (Signed)
Addended by: Liliane Bade on: 07/15/2016 10:37 AM   Modules accepted: Orders

## 2016-07-16 LAB — CLOSTRIDIUM DIFFICILE BY PCR: Toxigenic C. Difficile by PCR: NEGATIVE

## 2016-08-25 ENCOUNTER — Other Ambulatory Visit: Payer: Self-pay | Admitting: *Deleted

## 2016-08-25 MED ORDER — LEVOTHYROXINE SODIUM 75 MCG PO TABS: 75.0000 ug | ORAL_TABLET | ORAL | 0 refills | Status: DC

## 2016-09-13 ENCOUNTER — Telehealth: Payer: Self-pay | Admitting: *Deleted

## 2016-09-13 NOTE — Telephone Encounter (Signed)
Patient is wanting to know if she still needs to take her probiotics 4 times a day? Or can she go down to BID? Please advise

## 2016-09-13 NOTE — Telephone Encounter (Signed)
BID is good for long term maintenance.   Laroy Apple, MD [facility] 09/13/2016, 9:48 AM

## 2016-09-14 NOTE — Telephone Encounter (Signed)
Pt aware.

## 2016-10-26 ENCOUNTER — Ambulatory Visit (INDEPENDENT_AMBULATORY_CARE_PROVIDER_SITE_OTHER): Payer: PRIVATE HEALTH INSURANCE | Admitting: Family Medicine

## 2016-10-26 ENCOUNTER — Ambulatory Visit (INDEPENDENT_AMBULATORY_CARE_PROVIDER_SITE_OTHER): Payer: PRIVATE HEALTH INSURANCE

## 2016-10-26 ENCOUNTER — Encounter: Payer: Self-pay | Admitting: Family Medicine

## 2016-10-26 ENCOUNTER — Other Ambulatory Visit: Payer: Self-pay | Admitting: Family Medicine

## 2016-10-26 VITALS — BP 138/86 | HR 72 | Temp 97.2°F | Ht 61.0 in | Wt 155.0 lb

## 2016-10-26 DIAGNOSIS — M79671 Pain in right foot: Secondary | ICD-10-CM

## 2016-10-26 NOTE — Progress Notes (Signed)
   HPI  Patient presents today here with right foot pain.  Patient explains that over the weekend she was walking through the yard a new house and stepped into a hole rolling her foot and ankle inwardly.  She had ankle pain after that, this has improved. Now she's developed forefoot pain on the dorsal surface persistently. She comes in concern for fracture.  She has not tried any medication except for Tylenol for pain.  PMH: Smoking status noted ROS: Per HPI  Objective: BP 138/86   Pulse 72   Temp (!) 97.2 F (36.2 C) (Oral)   Ht 5' 1"  (1.549 m)   Wt 155 lb (70.3 kg)   BMI 29.29 kg/m  Gen: NAD, alert, cooperative with exam HEENT: NCAT CV: RRR, good S1/S2, no murmur Resp: CTABL, no wheezes, non-labored Ext: No edema, warm Neuro: Alert and oriented, No gross deficits  Msk:  No joint instability of the right ankle, full range of motion of the right ankle, tenderness to palpation along the first third, fourth, and fifth metatarsal No significant swelling or erythema, no gross deformity  Assessment and plan:  # Right foot pain Her ankle feels stable, her exam is reassuring, x-ray does not show any bony abnormality. Reassurance provided, supportive care discussed Follow-up as needed, I am happy to reimage if pain does not improve    Orders Placed This Encounter  Procedures  . DG Foot Complete Right    Standing Status:   Future    Standing Expiration Date:   12/26/2017    Order Specific Question:   Reason for Exam (SYMPTOM  OR DIAGNOSIS REQUIRED)    Answer:   R foot pain    Order Specific Question:   Is patient pregnant?    Answer:   No    Order Specific Question:   Preferred imaging location?    Answer:   Internal    Order Specific Question:   Radiology Contrast Protocol - do NOT remove file path    Answer:   \\[email or web address]    Laroy Apple, MD [facility] 10/26/2016, 5:19 PM

## 2016-11-10 ENCOUNTER — Telehealth: Payer: Self-pay | Admitting: *Deleted

## 2016-11-10 MED ORDER — LEVOTHYROXINE SODIUM 75 MCG PO TABS: 75.0000 ug | ORAL_TABLET | ORAL | 1 refills | Status: DC

## 2016-11-10 NOTE — Telephone Encounter (Signed)
TSH was done w/ [facility] on 06/15/16 looked in [place]

## 2016-12-23 ENCOUNTER — Other Ambulatory Visit: Payer: Self-pay | Admitting: Family

## 2017-01-13 ENCOUNTER — Ambulatory Visit: Payer: PRIVATE HEALTH INSURANCE | Admitting: Family

## 2017-01-13 ENCOUNTER — Encounter: Payer: Self-pay | Admitting: Family

## 2017-01-13 VITALS — BP 120/81 | HR 88 | Temp 97.4°F | Ht 61.0 in | Wt 175.0 lb

## 2017-01-13 DIAGNOSIS — R635 Abnormal weight gain: Secondary | ICD-10-CM

## 2017-01-13 DIAGNOSIS — E669 Obesity, unspecified: Secondary | ICD-10-CM | POA: Diagnosis not present

## 2017-01-13 DIAGNOSIS — Z713 Dietary counseling and surveillance: Secondary | ICD-10-CM | POA: Diagnosis not present

## 2017-01-13 MED ORDER — PHENTERMINE HCL 37.5 MG PO TABS: 37.5000 mg | ORAL_TABLET | Freq: Every day | ORAL | 2 refills | Status: DC

## 2017-01-13 NOTE — Patient Instructions (Signed)
Exercising to Lose Weight Exercising can help you to lose weight. In order to lose weight through exercise, you need to do vigorous-intensity exercise. You can tell that you are exercising with vigorous intensity if you are breathing very hard and fast and cannot hold a conversation while exercising. Moderate-intensity exercise helps to maintain your current weight. You can tell that you are exercising at a moderate level if you have a higher heart rate and faster breathing, but you are still able to hold a conversation. How often should I exercise? Choose an activity that you enjoy and set realistic goals. Your health care provider can help you to make an activity plan that works for you. Exercise regularly as directed by your health care provider. This may include:  Doing resistance training twice each week, such as: ? Push-ups. ? Sit-ups. ? Lifting weights. ? Using resistance bands.  Doing a given intensity of exercise for a given amount of time. Choose from these options: ? 150 minutes of moderate-intensity exercise every week. ? 75 minutes of vigorous-intensity exercise every week. ? A mix of moderate-intensity and vigorous-intensity exercise every week.  Children, pregnant women, people who are out of shape, people who are overweight, and older adults may need to consult a health care provider for individual recommendations. If you have any sort of medical condition, be sure to consult your health care provider before starting a new exercise program. What are some activities that can help me to lose weight?  Walking at a rate of at least 4.5 miles an hour.  Jogging or running at a rate of 5 miles per hour.  Biking at a rate of at least 10 miles per hour.  Lap swimming.  Roller-skating or in-line skating.  Cross-country skiing.  Vigorous competitive sports, such as football, basketball, and soccer.  Jumping rope.  Aerobic dancing. How can I be more active in my day-to-day  activities?  Use the stairs instead of the elevator.  Take a walk during your lunch break.  If you drive, park your car farther away from work or school.  If you take public transportation, get off one stop early and walk the rest of the way.  Make all of your phone calls while standing up and walking around.  Get up, stretch, and walk around every 30 minutes throughout the day. What guidelines should I follow while exercising?  Do not exercise so much that you hurt yourself, feel dizzy, or get very short of breath.  Consult your health care provider prior to starting a new exercise program.  Wear comfortable clothes and shoes with good support.  Drink plenty of water while you exercise to prevent dehydration or heat stroke. Body water is lost during exercise and must be replaced.  Work out until you breathe faster and your heart beats faster. This information is not intended to replace advice given to you by your health care provider. Make sure you discuss any questions you have with your health care provider. Document Released: 02/06/2010 Document Revised: 06/12/2015 Document Reviewed: 06/07/2013 Elsevier Interactive Patient Education  2018 [employer]  

## 2017-01-13 NOTE — Addendum Note (Signed)
Addended by: Evelina Dun A on: 01/13/2017 05:05 PM   Modules accepted: Orders

## 2017-01-13 NOTE — Progress Notes (Signed)
   Subjective:    Patient ID: Katherine Castro, female    DOB: 05/14/1970, 46 y.o.   MRN: [id number]  HPI PT presents to the office today to discuss weight loss management. PT states over the last 6 months she has gained over 40 ls. Pt states she has tried Weight Watchers in the past that helped, but wants to try a medication to help jump start her weight loss.     Review of Systems  All other systems reviewed and are negative.      Objective:   Physical Exam  Constitutional: She is oriented to person, place, and time. She appears well-developed and well-nourished. No distress.  HENT:  Head: Normocephalic and atraumatic.  Eyes: Pupils are equal, round, and reactive to light.  Neck: Normal range of motion. Neck supple. No thyromegaly present.  Cardiovascular: Normal rate, regular rhythm, normal heart sounds and intact distal pulses.  No murmur heard. Pulmonary/Chest: Effort normal and breath sounds normal. No respiratory distress. She has no wheezes.  Abdominal: Soft. Bowel sounds are normal. She exhibits no distension. There is no tenderness.  Musculoskeletal: Normal range of motion. She exhibits no edema or tenderness.  Neurological: She is alert and oriented to person, place, and time.  Skin: Skin is warm and dry.  Psychiatric: She has a normal mood and affect. Her behavior is normal. Judgment and thought content normal.  Vitals reviewed.     BP 120/81   Pulse 88   Temp (!) 97.4 F (36.3 C) (Oral)   Ht _0  (1.549 m)   Wt 175 lb (79.4 kg)   BMI 33.07 kg/m      Assessment & Plan:  1. Weight loss counseling, encounter for - phentermine (ADIPEX-P) 37.5 MG tablet; Take 1 tablet (37.5 mg total) by mouth daily before breakfast.  Dispense: 30 tablet; Refill: 2 - BMP8+EGFR  2. Obesity (BMI 30-39.9) - phentermine (ADIPEX-P) 37.5 MG tablet; Take 1 tablet (37.5 mg total) by mouth daily before breakfast.  Dispense: 30 tablet; Refill: 2 - BMP8+EGFR   Pt started on  Phentermine today Encourage healthy diet and exercise RTO in 3 months and must lose 5% of  Weight loss to continue  Evelina Dun, FNP

## 2017-03-30 ENCOUNTER — Ambulatory Visit: Payer: PRIVATE HEALTH INSURANCE | Admitting: Family

## 2017-04-09 ENCOUNTER — Other Ambulatory Visit: Payer: Self-pay | Admitting: Family Medicine

## 2017-04-28 ENCOUNTER — Encounter: Payer: Self-pay | Admitting: Family

## 2017-04-28 ENCOUNTER — Ambulatory Visit: Payer: BLUE CROSS/BLUE SHIELD | Admitting: Family

## 2017-04-28 VITALS — BP 122/81 | HR 64 | Temp 98.7°F | Ht 61.0 in | Wt 167.0 lb

## 2017-04-28 DIAGNOSIS — E559 Vitamin D deficiency, unspecified: Secondary | ICD-10-CM

## 2017-04-28 DIAGNOSIS — Z0000 Encounter for general adult medical examination without abnormal findings: Secondary | ICD-10-CM | POA: Diagnosis not present

## 2017-04-28 DIAGNOSIS — E039 Hypothyroidism, unspecified: Secondary | ICD-10-CM

## 2017-04-28 MED ORDER — LEVOTHYROXINE SODIUM 75 MCG PO TABS: 75.0000 ug | ORAL_TABLET | Freq: Every morning | ORAL | 3 refills | Status: DC

## 2017-04-28 NOTE — Patient Instructions (Signed)

## 2017-04-28 NOTE — Progress Notes (Addendum)
Subjective:    Patient ID: Katherine Castro, female    DOB: 12/20/1970, 47 y.o.   MRN: [id number]  PT presents to the office today for CPE without pap. Pt denies any headache, palpitations, SOB, or edema at this time.   Thyroid Problem  Presents for follow-up visit. Symptoms include fatigue and weight gain. Patient reports no constipation or diaphoresis. The symptoms have been stable.      Review of Systems  Constitutional: Positive for fatigue and weight gain. Negative for diaphoresis.  Gastrointestinal: Negative for constipation.  All other systems reviewed and are negative.  Family History  Problem Relation Age of Onset  . Diabetes Father   . Hypertension Father   . Hyperlipidemia Father   . Diabetes Paternal Grandmother   . Hyperlipidemia Paternal Grandmother   . Hypertension Paternal Grandmother     Social History   Socioeconomic History  . Marital status: Married    Spouse name: Not on file  . Number of children: Not on file  . Years of education: Not on file  . Highest education level: Not on file  Occupational History  . Not on file  Social Needs  . Financial resource strain: Not on file  . Food insecurity:    Worry: Not on file    Inability: Not on file  . Transportation needs:    Medical: Not on file    Non-medical: Not on file  Tobacco Use  . Smoking status: Never Smoker  . Smokeless tobacco: Never Used  Substance and Sexual Activity  . Alcohol use: No  . Drug use: No  . Sexual activity: Not on file  Lifestyle  . Physical activity:    Days per week: Not on file    Minutes per session: Not on file  . Stress: Not on file  Relationships  . Social connections:    Talks on phone: Not on file    Gets together: Not on file    Attends religious service: Not on file    Active member of club or organization: Not on file    Attends meetings of clubs or organizations: Not on file    Relationship status: Not on file  Other Topics Concern  . Not on  file  Social History Narrative  . Not on file       Objective:   Physical Exam  Constitutional: She is oriented to person, place, and time. She appears well-developed and well-nourished. No distress.  HENT:  Head: Normocephalic and atraumatic.  Right Ear: External ear normal.  Left Ear: External ear normal.  Nose: Nose normal.  Mouth/Throat: Oropharynx is clear and moist.  Eyes: Pupils are equal, round, and reactive to light.  Neck: Normal range of motion. Neck supple. No thyromegaly present.  Cardiovascular: Normal rate, regular rhythm, normal heart sounds and intact distal pulses.  No murmur heard. Pulmonary/Chest: Effort normal and breath sounds normal. No respiratory distress. She has no wheezes.  Abdominal: Soft. Bowel sounds are normal. She exhibits no distension. There is no tenderness.  Musculoskeletal: Normal range of motion. She exhibits no edema or tenderness.  Neurological: She is alert and oriented to person, place, and time.  Skin: Skin is warm and dry.  Psychiatric: She has a normal mood and affect. Her behavior is normal. Judgment and thought content normal.  Vitals reviewed.   BP 122/81   Pulse 64   Temp 98.7 F (37.1 C) (Oral)   Ht 5' 1"  (1.549 m)   Wt 167 lb (  75.8 kg)   BMI 31.55 kg/m      Assessment & Plan:  1. Annual physical exam - Anemia Profile B - CMP14+EGFR - Lipid panel - TSH - VITAMIN D 25 Hydroxy (Vit-D Deficiency, Fractures)  2. Acquired hypothyroidism - CMP14+EGFR - TSH - levothyroxine (SYNTHROID, LEVOTHROID) 75 MCG tablet; Take 1 tablet (75 mcg total) by mouth every morning.  Dispense: 90 tablet; Refill: 3  3. Vitamin D deficiency - CMP14+EGFR - VITAMIN D 25 Hydroxy (Vit-D Deficiency, Fractures)   Continue all meds Labs pending Health Maintenance reviewed- Will get mammogram report faxed and scanned into chart Diet and exercise encouraged RTO 1 year  Evelina Dun, FNP

## 2017-04-29 ENCOUNTER — Encounter: Payer: Self-pay | Admitting: Family

## 2017-04-29 ENCOUNTER — Other Ambulatory Visit: Payer: Self-pay | Admitting: Family

## 2017-04-29 LAB — CMP14+EGFR
ALT: 22 IU/L (ref 0–32)
AST: 21 IU/L (ref 0–40)
Albumin/Globulin Ratio: 1.7 (ref 1.2–2.2)
Albumin: 4.7 g/dL (ref 3.5–5.5)
Alkaline Phosphatase: 85 IU/L (ref 39–117)
BUN/Creatinine Ratio: 22 (ref 9–23)
BUN: 14 mg/dL (ref 6–24)
Bilirubin Total: 0.4 mg/dL (ref 0.0–1.2)
CALCIUM: 9.8 mg/dL (ref 8.7–10.2)
CO2: 25 mmol/L (ref 20–29)
CREATININE: 0.65 mg/dL (ref 0.57–1.00)
Chloride: 106 mmol/L (ref 96–106)
GFR, EST AFRICAN AMERICAN: 124 mL/min/{1.73_m2} (ref >59)
GFR, EST NON AFRICAN AMERICAN: 108 mL/min/{1.73_m2} (ref >59)
GLOBULIN, TOTAL: 2.7 g/dL (ref 1.5–4.5)
Glucose: 92 mg/dL (ref 65–99)
Potassium: 5 mmol/L (ref 3.5–5.2)
Sodium: 144 mmol/L (ref 134–144)
TOTAL PROTEIN: 7.4 g/dL (ref 6.0–8.5)

## 2017-04-29 LAB — LIPID PANEL
Chol/HDL Ratio: 3.3 ratio (ref 0.0–4.4)
Cholesterol, Total: 199 mg/dL (ref 100–199)
HDL: 60 mg/dL (ref >39)
LDL CALC: 125 mg/dL — AB (ref 0–99)
TRIGLYCERIDES: 71 mg/dL (ref 0–149)
VLDL Cholesterol Cal: 14 mg/dL (ref 5–40)

## 2017-04-29 LAB — ANEMIA PROFILE B
BASOS: 0 %
Basophils Absolute: 0 10*3/uL (ref 0.0–0.2)
EOS (ABSOLUTE): 0.2 10*3/uL (ref 0.0–0.4)
EOS: 4 %
FERRITIN: 73 ng/mL (ref 15–150)
Folate: 18 ng/mL (ref >3.0)
HEMATOCRIT: 41.8 % (ref 34.0–46.6)
HEMOGLOBIN: 13.9 g/dL (ref 11.1–15.9)
IMMATURE GRANULOCYTES: 0 %
IRON SATURATION: 24 % (ref 15–55)
Immature Grans (Abs): 0 10*3/uL (ref 0.0–0.1)
Iron: 74 ug/dL (ref 27–159)
LYMPHS: 31 %
Lymphocytes Absolute: 1.7 10*3/uL (ref 0.7–3.1)
MCH: 28.8 pg (ref 26.6–33.0)
MCHC: 33.3 g/dL (ref 31.5–35.7)
MCV: 87 fL (ref 79–97)
MONOCYTES: 8 %
MONOS ABS: 0.5 10*3/uL (ref 0.1–0.9)
NEUTROS PCT: 57 %
Neutrophils Absolute: 3.2 10*3/uL (ref 1.4–7.0)
Platelets: 279 10*3/uL (ref 150–379)
RBC: 4.83 x10E6/uL (ref 3.77–5.28)
RDW: 14.2 % (ref 12.3–15.4)
RETIC CT PCT: 1.1 % (ref 0.6–2.6)
TIBC: 303 ug/dL (ref 250–450)
UIBC: 229 ug/dL (ref 131–425)
Vitamin B-12: 341 pg/mL (ref 232–1245)
WBC: 5.7 10*3/uL (ref 3.4–10.8)

## 2017-04-29 LAB — VITAMIN D 25 HYDROXY (VIT D DEFICIENCY, FRACTURES): Vit D, 25-Hydroxy: 23.5 ng/mL — ABNORMAL LOW (ref 30.0–100.0)

## 2017-04-29 LAB — TSH: TSH: 0.168 u[IU]/mL — AB (ref 0.450–4.500)

## 2017-04-29 MED ORDER — VITAMIN D (ERGOCALCIFEROL) 1.25 MG (50000 UNIT) PO CAPS: 50000.0000 [IU] | ORAL_CAPSULE | ORAL | 3 refills | Status: DC

## 2017-04-29 MED ORDER — LEVOTHYROXINE SODIUM 50 MCG PO TABS: 50.0000 ug | ORAL_TABLET | Freq: Every day | ORAL | 3 refills | Status: DC

## 2017-06-14 ENCOUNTER — Encounter: Payer: Self-pay | Admitting: Family Medicine

## 2017-06-14 ENCOUNTER — Ambulatory Visit: Payer: BLUE CROSS/BLUE SHIELD | Admitting: Family Medicine

## 2017-06-14 VITALS — BP 128/90 | HR 59 | Temp 97.9°F | Ht 61.0 in | Wt 168.4 lb

## 2017-06-14 DIAGNOSIS — R197 Diarrhea, unspecified: Secondary | ICD-10-CM

## 2017-06-14 MED ORDER — VANCOMYCIN HCL 125 MG PO CAPS: ORAL_CAPSULE | ORAL | 0 refills | Status: DC

## 2017-06-14 NOTE — Addendum Note (Signed)
Addended by: Denyce Robert on: 06/14/2017 10:48 AM   Modules accepted: Orders

## 2017-06-14 NOTE — Progress Notes (Signed)
   HPI  Patient presents today here with abdominal pain and diarrhea  She has history of C. difficile colitis, this was initially treated with 2 courses of metronidazole and then improved with a course of vancomycin p.o.  She has been on probiotics 4 times daily for many months now. She states over the last month she has begun having recurrent symptoms suspicious of C. difficile. Patient states she has had loose stools intermittently for a few weeks and then over the last week or so has had loose stools after every time she eats.  She denies any history or recent use of antibiotics.  She has continued to take her probiotics 4 times daily.  Patient has not eaten last 2 days due to concerns about having recurrent severe diarrhea.   PMH: Smoking status noted ROS: Per HPI  Objective: BP 128/90   Pulse (!) 59   Temp 97.9 F (36.6 C) (Oral)   Ht 5' 1"  (1.549 m)   Wt 168 lb 6.4 oz (76.4 kg)   BMI 31.82 kg/m  Gen: NAD, alert, cooperative with exam HEENT: NCAT, EOMI, PERRL CV: RRR, good S1/S2, no murmur Resp: CTABL, no wheezes, non-labored Abd: Soft, mild tenderness to palpation in the periumbilical area with no guarding. Positive bowel sounds. 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 difficile testing today before starting antibiotics. Vancomycin orally dosed for recurrent episode, 4 times daily for 10 days, then 2 times daily for 1 week, then 1 time daily for 1 week, then once every 3 days to finish 8 weeks of treatment Refer to GI     Orders Placed This Encounter  Procedures  . Clostridium Difficile by PCR    Order Specific Question:   Is your patient experiencing loose or watery stools (3 or more in 24 hours)?    Answer:   Yes    Order Specific Question:   Has the patient received laxatives in the last 24 hours?    Answer:   No    Order Specific  Question:   Has a negative Cdiff test resulted in the last 7 days?    Answer:   No  . Ambulatory referral to Gastroenterology    Referral Priority:   Routine    Referral Type:   Consultation    Referral Reason:   Specialty Services Required    Referred to Provider:   Ronald Lobo, MD    Number of Visits Requested:   1    Meds ordered this encounter  Medications  . vancomycin (VANCOCIN) 125 MG capsule    Sig: 4 times daily for 10 days, then 2 times daily for 7 days, then 1 times daily for 7 days, then once every 3 days until finished    Dispense:  76 capsule    Refill:  [place], MD [facility] 06/14/2017, 9:43 AM

## 2017-06-14 NOTE — Patient Instructions (Signed)
Great to see you!   

## 2017-06-16 LAB — CLOSTRIDIUM DIFFICILE BY PCR: Toxigenic C. Difficile by PCR: NEGATIVE

## 2017-06-20 ENCOUNTER — Other Ambulatory Visit: Payer: Self-pay | Admitting: Family Medicine

## 2017-06-20 ENCOUNTER — Other Ambulatory Visit: Payer: BLUE CROSS/BLUE SHIELD

## 2017-06-20 DIAGNOSIS — W57XXXA Bitten or stung by nonvenomous insect and other nonvenomous arthropods, initial encounter: Secondary | ICD-10-CM

## 2017-06-20 NOTE — Progress Notes (Signed)
Update from daughter  Patient continues to have diarrhea after each episode of eating. She also has fatigue.  She states that she did remember over the weekend that she had a tick bite on the right flank about 6 weeks ago.  No formal fevers measured, no rash.  Katherine Apple, MD [facility] 06/20/2017, 8:10 AM

## 2017-06-22 LAB — CBC WITH DIFFERENTIAL/PLATELET
BASOS: 0 %
Basophils Absolute: 0 10*3/uL (ref 0.0–0.2)
EOS (ABSOLUTE): 0.1 10*3/uL (ref 0.0–0.4)
EOS: 2 %
HEMATOCRIT: 40.3 % (ref 34.0–46.6)
Hemoglobin: 13.7 g/dL (ref 11.1–15.9)
IMMATURE GRANS (ABS): 0 10*3/uL (ref 0.0–0.1)
Immature Granulocytes: 0 %
LYMPHS: 23 %
Lymphocytes Absolute: 1.7 10*3/uL (ref 0.7–3.1)
MCH: 28.6 pg (ref 26.6–33.0)
MCHC: 34 g/dL (ref 31.5–35.7)
MCV: 84 fL (ref 79–97)
MONOCYTES: 7 %
Monocytes Absolute: 0.5 10*3/uL (ref 0.1–0.9)
NEUTROS ABS: 5 10*3/uL (ref 1.4–7.0)
Neutrophils: 68 %
Platelets: 296 10*3/uL (ref 150–450)
RBC: 4.79 x10E6/uL (ref 3.77–5.28)
RDW: 14.5 % (ref 12.3–15.4)
WBC: 7.4 10*3/uL (ref 3.4–10.8)

## 2017-06-22 LAB — ROCKY MTN SPOTTED FVR ABS PNL(IGG+IGM)
RMSF IGM: 0.23 {index} (ref 0.00–0.89)
RMSF IgG: NEGATIVE

## 2017-06-22 LAB — LYME AB/WESTERN BLOT REFLEX
LYME DISEASE AB, QUANT, IGM: <0.8 index (ref 0.00–0.79)
Lyme IgG/IgM Ab: <0.91 {ISR} (ref 0.00–0.90)

## 2017-07-22 ENCOUNTER — Other Ambulatory Visit: Payer: Self-pay | Admitting: Family

## 2017-07-22 ENCOUNTER — Other Ambulatory Visit: Payer: BLUE CROSS/BLUE SHIELD

## 2017-07-22 DIAGNOSIS — E039 Hypothyroidism, unspecified: Secondary | ICD-10-CM

## 2017-07-23 LAB — TSH: TSH: 4 u[IU]/mL (ref 0.450–4.500)

## 2017-07-26 ENCOUNTER — Other Ambulatory Visit: Payer: Self-pay | Admitting: *Deleted

## 2017-07-26 MED ORDER — LEVOTHYROXINE SODIUM 50 MCG PO TABS: 50.0000 ug | ORAL_TABLET | Freq: Every day | ORAL | 3 refills | Status: DC

## 2017-12-06 ENCOUNTER — Ambulatory Visit: Payer: PRIVATE HEALTH INSURANCE | Admitting: Plastic Surgery

## 2017-12-27 ENCOUNTER — Ambulatory Visit: Payer: BLUE CROSS/BLUE SHIELD | Admitting: Plastic Surgery

## 2017-12-27 ENCOUNTER — Encounter: Payer: Self-pay | Admitting: Plastic Surgery

## 2017-12-27 VITALS — BP 100/80 | HR 80 | Ht 61.0 in | Wt 170.0 lb

## 2017-12-27 DIAGNOSIS — W540XXD Bitten by dog, subsequent encounter: Secondary | ICD-10-CM

## 2017-12-27 DIAGNOSIS — S0185XD Open bite of other part of head, subsequent encounter: Secondary | ICD-10-CM | POA: Diagnosis not present

## 2017-12-27 NOTE — Progress Notes (Signed)
     Patient ID: Katherine Castro, female    DOB: 03/24/1970, 47 y.o.   MRN: [id number]   Chief Complaint  Patient presents with  . Follow-up    on dog bite on forehead  . Skin Problem    Katherine Castro is a 47 yrs old wf here for follow up on her facial dog bite.  She was bitten by a dog in the face and underwent repair.  She had a revision in July 2019.  She is pleased with the improvement.  The nasolabial fold scar has soften and improved in appearance.  The nose has improved but there is still fullness superior to the tip on the right.  There is no sign of infection.     Review of Systems  Constitutional: Negative.   HENT: Negative.   Eyes: Negative.   Respiratory: Negative.   Cardiovascular: Negative.   Gastrointestinal: Positive for diarrhea.  Genitourinary: Negative.   Musculoskeletal: Negative.   Hematological: Negative.   Psychiatric/Behavioral: Negative.     Past Medical History:  Diagnosis Date  . Thyroid disease     Past Surgical History:  Procedure Laterality Date  . ABDOMINAL HYSTERECTOMY        Current Outpatient Medications:  .  levothyroxine (SYNTHROID) 50 MCG tablet, Take 1 tablet (50 mcg total) by mouth daily before breakfast., Disp: 90 tablet, Rfl: 3 .  OVER THE COUNTER MEDICATION, 4 (four) times daily as needed. , Disp: , Rfl:  .  PREVALITE 4 GM/DOSE powder, Take as directed., Disp: , Rfl:  .  Vitamin D, Ergocalciferol, (DRISDOL) 50000 units CAPS capsule, Take 1 capsule (50,000 Units total) by mouth every 7 (seven) days., Disp: 12 capsule, Rfl: 3   Objective:   Vitals:   12/27/17 1537  BP: 100/80  Pulse: 80  SpO2: 96%    Physical Exam  Constitutional: She is oriented to person, place, and time. She appears well-developed and well-nourished.  HENT:  Head: Normocephalic.  Eyes: Pupils are equal, round, and reactive to light. EOM are normal.  Cardiovascular: Normal rate.  Pulmonary/Chest: Effort normal.  Abdominal: Soft.  Neurological: She  is alert and oriented to person, place, and time.  Skin: Skin is warm.  Psychiatric: She has a normal mood and affect. Her behavior is normal. Judgment and thought content normal.    Assessment & Plan:  Dog bite of face, subsequent encounter   I think we can improve on the nose with small defating / revision of the scar with excision.  She is in agreement.    [place], DO

## 2018-04-03 ENCOUNTER — Other Ambulatory Visit: Payer: Self-pay | Admitting: *Deleted

## 2018-04-03 MED ORDER — LEVOTHYROXINE SODIUM 50 MCG PO TABS: 50.0000 ug | ORAL_TABLET | Freq: Every day | ORAL | 0 refills | Status: DC

## 2018-04-07 ENCOUNTER — Ambulatory Visit: Payer: BLUE CROSS/BLUE SHIELD | Admitting: Plastic Surgery

## 2018-08-14 ENCOUNTER — Other Ambulatory Visit: Payer: Self-pay

## 2018-08-14 ENCOUNTER — Encounter: Payer: Self-pay | Admitting: Family Medicine

## 2018-08-14 ENCOUNTER — Ambulatory Visit: Payer: BLUE CROSS/BLUE SHIELD | Admitting: Family Medicine

## 2018-08-14 VITALS — BP 107/81 | HR 75 | Temp 99.3°F | Ht 61.0 in | Wt 162.8 lb

## 2018-08-14 DIAGNOSIS — E039 Hypothyroidism, unspecified: Secondary | ICD-10-CM

## 2018-08-14 DIAGNOSIS — W57XXXA Bitten or stung by nonvenomous insect and other nonvenomous arthropods, initial encounter: Secondary | ICD-10-CM

## 2018-08-14 DIAGNOSIS — R200 Anesthesia of skin: Secondary | ICD-10-CM | POA: Diagnosis not present

## 2018-08-14 DIAGNOSIS — S30861A Insect bite (nonvenomous) of abdominal wall, initial encounter: Secondary | ICD-10-CM | POA: Diagnosis not present

## 2018-08-14 NOTE — Progress Notes (Signed)
BP 107/81   Pulse 75   Temp 99.3 F (37.4 C) (Temporal)   Ht 5' 1"  (1.549 m)   Wt 162 lb 12.8 oz (73.8 kg)   BMI 30.76 kg/m    Subjective:   Patient ID: Katherine Castro, female    DOB: 08-31-1970, 48 y.o.   MRN: [id number]  HPI: Katherine Castro is a 48 y.o. female presenting on 08/14/2018 for Insect Bite (left lower leg- x 3 days), Numbness (facial numbness - x 2 days. Patient states she has had two tick bites.), and Hypothyroidism (labs )   HPI Insect bite on left shin, appears to be going down the swelling, recommend triple antibiotic ointment and Benadryl as needed, it was more swollen but the Benadryl really knocked it down, she does not have any fevers or chills or redness or warmth there.  Facial numbness Patient complains of facial numbness this been worse at night over the past 2 nights.  It is in bilateral on both her maxillary region and then last night extended down into the rest of her lower face as well.  She says she has not had anything similar to this previously since facial dog bite 2 years ago but that all went away and she has not had numbness since until the past 2 days.  She denies any closing up of the throat or trouble breathing but mainly just the numbness on her face.  She denies any cough or congestion or sinus pressure.  The numbness made her feel weird at night especially when she laid down she felt it was worse and it kept her up some last night.  Tick bite right lower abdomen Patient has a tick bite on her right lower abdomen that was a couple weeks ago and she still has a spot that is healing up but she denies having any other rash or drainage or soreness there but just had the one spot there.  She does have other family members who have alpha gal allergy.  Hypothyroidism recheck Patient is coming in for thyroid recheck today as well. They deny any issues with hair changes or heat or cold problems or diarrhea or constipation. They deny any chest pain  or palpitations. They are currently on levothyroxine 15mcrograms   Relevant past medical, surgical, family and social history reviewed and updated as indicated. Interim medical history since our last visit reviewed. Allergies and medications reviewed and updated.  Review of Systems  Constitutional: Negative for chills and fever.  HENT: Negative for congestion, ear discharge, ear pain, rhinorrhea, sinus pressure, sinus pain and sore throat.   Eyes: Negative for redness and visual disturbance.  Respiratory: Negative for chest tightness and shortness of breath.   Cardiovascular: Negative for chest pain and leg swelling.  Musculoskeletal: Negative for back pain and gait problem.  Skin: Negative for rash.  Neurological: Positive for numbness. Negative for dizziness, speech difficulty, weakness, light-headedness and headaches.  Psychiatric/Behavioral: Negative for agitation, behavioral problems, self-injury, sleep disturbance and suicidal ideas.  All other systems reviewed and are negative.   Per HPI unless specifically indicated above   Allergies as of 08/14/2018      Reactions   Bee Venom Swelling   Cellulitis around site   Penicillins Rash   Has patient had a PCN reaction causing immediate rash, facial/tongue/throat swelling, SOB or lightheadedness with hypotension: yes Has patient had a PCN reaction causing severe rash involving mucus membranes or skin necrosis: no Has patient had a PCN reaction that  required hospitalization: no Has patient had a PCN reaction occurring within the last 10 years:  no If all of the above answers are "NO", then may proceed with Cephalosporin use.      Medication List       Accurate as of August 14, 2018  1:27 PM. If you have any questions, ask your nurse or doctor.        STOP taking these medications   Prevalite 4 GM/DOSE powder Generic drug: cholestyramine light Stopped by: Fransisca Kaufmann [place], MD     TAKE these medications   levothyroxine 50  MCG tablet Commonly known as: Synthroid Take 1 tablet (50 mcg total) by mouth daily before breakfast.   OVER THE COUNTER MEDICATION 4 (four) times daily as needed.   Vitamin D (Ergocalciferol) 1.25 MG (50000 UT) Caps capsule Commonly known as: DRISDOL Take 1 capsule (50,000 Units total) by mouth every 7 (seven) days.        Objective:   BP 107/81   Pulse 75   Temp 99.3 F (37.4 C) (Temporal)   Ht 5' 1"  (1.549 m)   Wt 162 lb 12.8 oz (73.8 kg)   BMI 30.76 kg/m   Wt Readings from Last 3 Encounters:  08/14/18 162 lb 12.8 oz (73.8 kg)  12/27/17 170 lb (77.1 kg)  06/14/17 168 lb 6.4 oz (76.4 kg)    Physical Exam Vitals signs and nursing note reviewed.  Constitutional:      General: She is not in acute distress.    Appearance: She is well-developed. She is not diaphoretic.  Eyes:     Extraocular Movements: Extraocular movements intact.     Conjunctiva/sclera: Conjunctivae normal.     Pupils: Pupils are equal, round, and reactive to light.  Cardiovascular:     Rate and Rhythm: Normal rate and regular rhythm.     Heart sounds: Normal heart sounds. No murmur.  Pulmonary:     Effort: Pulmonary effort is normal. No respiratory distress.     Breath sounds: Normal breath sounds. No wheezing.  Musculoskeletal: Normal range of motion.        General: No tenderness.  Skin:    General: Skin is warm and dry.     Findings: No rash.  Neurological:     Mental Status: She is alert and oriented to person, place, and time.     Cranial Nerves: No cranial nerve deficit.     Sensory: Sensory deficit (Numbness bilaterally in the zygomatic and maxillary region) present.     Motor: No weakness.     Coordination: Coordination normal.     Gait: Gait normal.  Psychiatric:        Behavior: Behavior normal.     Assessment & Plan:   Problem List Items Addressed This Visit    None    Visit Diagnoses    Facial numbness    -  Primary   Relevant Orders   CBC with Differential/Platelet    CMP14+EGFR   TSH   Vitamin B12   Lyme Ab/Western Blot Reflex   Alpha-Gal Panel   Rocky mtn spotted fvr abs pnl(IgG+IgM)   Tick bite of abdomen, initial encounter       Relevant Orders   Lyme Ab/Western Blot Reflex   Alpha-Gal Panel   Rocky mtn spotted fvr abs pnl(IgG+IgM)   Hypothyroidism, unspecified type           Follow up plan: Return in about 6 months (around 02/14/2019), or if symptoms worsen or fail to  improve, for Thyroid recheck.  Counseling provided for all of the vaccine components Orders Placed This Encounter  Procedures  . CBC with Differential/Platelet  . CMP14+EGFR  . TSH  . Vitamin B12  . Lyme Ab/Western Blot Reflex  . Alpha-Gal Panel  . Rocky mtn spotted fvr abs pnl(IgG+IgM)    Caryl Pina, MD [facility] 08/14/2018, 1:27 PM

## 2018-08-16 ENCOUNTER — Other Ambulatory Visit: Payer: Self-pay | Admitting: *Deleted

## 2018-08-16 MED ORDER — LEVOTHYROXINE SODIUM 75 MCG PO TABS: 75.0000 ug | ORAL_TABLET | Freq: Every day | ORAL | 4 refills | Status: DC

## 2018-08-21 LAB — CMP14+EGFR
ALT: 19 IU/L (ref 0–32)
AST: 19 IU/L (ref 0–40)
Albumin/Globulin Ratio: 1.9 (ref 1.2–2.2)
Albumin: 5 g/dL — ABNORMAL HIGH (ref 3.8–4.8)
Alkaline Phosphatase: 83 IU/L (ref 39–117)
BUN/Creatinine Ratio: 21 (ref 9–23)
BUN: 11 mg/dL (ref 6–24)
Bilirubin Total: 0.3 mg/dL (ref 0.0–1.2)
CO2: 21 mmol/L (ref 20–29)
Calcium: 9.9 mg/dL (ref 8.7–10.2)
Chloride: 102 mmol/L (ref 96–106)
Creatinine, Ser: 0.53 mg/dL — ABNORMAL LOW (ref 0.57–1.00)
GFR calc Af Amer: 132 mL/min/{1.73_m2} (ref >59)
GFR calc non Af Amer: 114 mL/min/{1.73_m2} (ref >59)
Globulin, Total: 2.7 g/dL (ref 1.5–4.5)
Glucose: 92 mg/dL (ref 65–99)
Potassium: 4 mmol/L (ref 3.5–5.2)
Sodium: 141 mmol/L (ref 134–144)
Total Protein: 7.7 g/dL (ref 6.0–8.5)

## 2018-08-21 LAB — ALPHA-GAL PANEL
Alpha Gal IgE*: 11.4 kU/L — ABNORMAL HIGH (ref <0.10)
Beef (Bos spp) IgE: 4.49 kU/L — ABNORMAL HIGH (ref <0.35)
Class Interpretation: 2
Class Interpretation: 2
Class Interpretation: 3
Lamb/Mutton (Ovis spp) IgE: 0.85 kU/L — ABNORMAL HIGH (ref <0.35)
Pork (Sus spp) IgE: 2.22 kU/L — ABNORMAL HIGH (ref <0.35)

## 2018-08-21 LAB — CBC WITH DIFFERENTIAL/PLATELET
Basophils Absolute: 0.1 10*3/uL (ref 0.0–0.2)
Basos: 1 %
EOS (ABSOLUTE): 0.3 10*3/uL (ref 0.0–0.4)
Eos: 4 %
Hematocrit: 44 % (ref 34.0–46.6)
Hemoglobin: 14.3 g/dL (ref 11.1–15.9)
Immature Grans (Abs): 0 10*3/uL (ref 0.0–0.1)
Immature Granulocytes: 0 %
Lymphocytes Absolute: 2 10*3/uL (ref 0.7–3.1)
Lymphs: 27 %
MCH: 28 pg (ref 26.6–33.0)
MCHC: 32.5 g/dL (ref 31.5–35.7)
MCV: 86 fL (ref 79–97)
Monocytes Absolute: 0.6 10*3/uL (ref 0.1–0.9)
Monocytes: 8 %
Neutrophils Absolute: 4.4 10*3/uL (ref 1.4–7.0)
Neutrophils: 60 %
Platelets: 290 10*3/uL (ref 150–450)
RBC: 5.1 x10E6/uL (ref 3.77–5.28)
RDW: 13.8 % (ref 11.7–15.4)
WBC: 7.3 10*3/uL (ref 3.4–10.8)

## 2018-08-21 LAB — VITAMIN B12: Vitamin B-12: >2000 pg/mL — ABNORMAL HIGH (ref 232–1245)

## 2018-08-21 LAB — TSH: TSH: 7.66 u[IU]/mL — ABNORMAL HIGH (ref 0.450–4.500)

## 2018-08-21 LAB — ROCKY MTN SPOTTED FVR ABS PNL(IGG+IGM)
RMSF IgG: NEGATIVE
RMSF IgM: 0.28 index (ref 0.00–0.89)

## 2018-08-21 LAB — LYME AB/WESTERN BLOT REFLEX
LYME DISEASE AB, QUANT, IGM: <0.8 index (ref 0.00–0.79)
Lyme IgG/IgM Ab: <0.91 {ISR} (ref 0.00–0.90)

## 2018-10-16 ENCOUNTER — Encounter: Payer: Self-pay | Admitting: Physician Assistant

## 2018-10-16 ENCOUNTER — Ambulatory Visit: Payer: BC Managed Care – PPO | Admitting: Physician Assistant

## 2018-10-16 ENCOUNTER — Other Ambulatory Visit: Payer: Self-pay

## 2018-10-16 VITALS — BP 115/78 | HR 74 | Temp 96.2°F | Ht 61.0 in | Wt 156.2 lb

## 2018-10-16 DIAGNOSIS — E039 Hypothyroidism, unspecified: Secondary | ICD-10-CM

## 2018-10-16 DIAGNOSIS — R5382 Chronic fatigue, unspecified: Secondary | ICD-10-CM

## 2018-10-16 NOTE — Progress Notes (Signed)
BP 115/78    Pulse 74    Temp (!) 96.2 F (35.7 C) (Temporal)    Ht 5' 1" (1.549 m)    Wt 156 lb 3.2 oz (70.9 kg)    SpO2 96%    BMI 29.51 kg/m    Subjective:    Patient ID: Katherine Castro, female    DOB: Jun 03, 1970, 48 y.o.   MRN: [id number]  HPI: Katherine Castro is a 48 y.o. female presenting on 10/16/2018 for Fatigue (x 2 days. Patient gets lipotropic injections for weight loss that was started a few months ago. Has been on and off) and facial numbness  For the past few weeks the patient has had increasing fatigue and nighttime face tingling.  Over the weekend she had to sleep both Saturday and Sunday most all of the day because she felt so tired and weak.  She did get her B12/amino acid supplement shot on Saturday morning through bariatric clinic that she goes to.  She gets this every 2 weeks.  I have asked her to plot out when the symptoms are the most severe and if they are located in the days following the injection there could be normal gradient and that that she is sensitive to. In addition, she does have hypothyroidism that had to have an adjustment in July.  We will recheck some labs today to see if there is anything abnormal with her comp, CBC, thyroid.  We discussed that she may need to discontinue those injections if there is a pattern of it causing it more if there is no other thing that we can find in her blood work.  And that way in a month's time she can see if the symptoms completely go away.  Past Medical History:  Diagnosis Date   Thyroid disease    Relevant past medical, surgical, family and social history reviewed and updated as indicated. Interim medical history since our last visit reviewed. Allergies and medications reviewed and updated. DATA REVIEWED: CHART IN EPIC  Family History reviewed for pertinent findings.  Review of Systems  Constitutional: Positive for fatigue. Negative for activity change, appetite change, chills, diaphoresis, fever and  unexpected weight change.  HENT: Negative.   Eyes: Negative.   Respiratory: Negative.  Negative for cough.   Cardiovascular: Negative.  Negative for chest pain.  Gastrointestinal: Negative.  Negative for abdominal pain.  Endocrine: Negative.   Genitourinary: Negative.  Negative for dysuria.  Musculoskeletal: Negative.   Skin: Negative.   Neurological: Negative.  Negative for dizziness, tremors, speech difficulty, weakness, light-headedness and numbness.  Psychiatric/Behavioral: Positive for sleep disturbance.    Allergies as of 10/16/2018      Reactions   Bee Venom Swelling   Cellulitis around site   Penicillins Rash   Has patient had a PCN reaction causing immediate rash, facial/tongue/throat swelling, SOB or lightheadedness with hypotension: yes Has patient had a PCN reaction causing severe rash involving mucus membranes or skin necrosis: no Has patient had a PCN reaction that required hospitalization: no Has patient had a PCN reaction occurring within the last 10 years:  no If all of the above answers are "NO", then may proceed with Cephalosporin use.      Medication List       Accurate as of October 16, 2018 10:37 AM. If you have any questions, ask your nurse or doctor.        levothyroxine 75 MCG tablet Commonly known as: SYNTHROID Take 1 tablet (75 mcg  total) by mouth daily.   OVER THE COUNTER MEDICATION 4 (four) times daily as needed.   phentermine 37.5 MG tablet Commonly known as: ADIPEX-P Take 37.5 mg by mouth daily.   Vitamin D (Ergocalciferol) 1.25 MG (50000 UT) Caps capsule Commonly known as: DRISDOL Take 1 capsule (50,000 Units total) by mouth every 7 (seven) days.          Objective:    BP 115/78    Pulse 74    Temp (!) 96.2 F (35.7 C) (Temporal)    Ht 5' 1"  (1.549 m)    Wt 156 lb 3.2 oz (70.9 kg)    SpO2 96%    BMI 29.51 kg/m   Allergies  Allergen Reactions   Bee Venom Swelling    Cellulitis around site   Penicillins Rash    Has patient  had a PCN reaction causing immediate rash, facial/tongue/throat swelling, SOB or lightheadedness with hypotension: yes Has patient had a PCN reaction causing severe rash involving mucus membranes or skin necrosis: no Has patient had a PCN reaction that required hospitalization: no Has patient had a PCN reaction occurring within the last 10 years:  no If all of the above answers are "NO", then may proceed with Cephalosporin use.     Wt Readings from Last 3 Encounters:  10/16/18 156 lb 3.2 oz (70.9 kg)  08/14/18 162 lb 12.8 oz (73.8 kg)  12/27/17 170 lb (77.1 kg)    Physical Exam Constitutional:      General: She is not in acute distress.    Appearance: Normal appearance. She is well-developed.  HENT:     Head: Normocephalic and atraumatic.  Cardiovascular:     Rate and Rhythm: Normal rate.  Pulmonary:     Effort: Pulmonary effort is normal.  Skin:    General: Skin is warm and dry.     Findings: No rash.  Neurological:     Mental Status: She is alert and oriented to person, place, and time.     Deep Tendon Reflexes: Reflexes are normal and symmetric.     Results for orders placed or performed in visit on 08/14/18  CBC with Differential/Platelet  Result Value Ref Range   WBC 7.3 3.4 - 10.8 x10E3/uL   RBC 5.10 3.77 - 5.28 x10E6/uL   Hemoglobin 14.3 11.1 - 15.9 g/dL   Hematocrit 44.0 34.0 - 46.6 %   MCV 86 79 - 97 fL   MCH 28.0 26.6 - 33.0 pg   MCHC 32.5 31.5 - 35.7 g/dL   RDW 13.8 11.7 - 15.4 %   Platelets 290 150 - 450 x10E3/uL   Neutrophils 60 Not Estab. %   Lymphs 27 Not Estab. %   Monocytes 8 Not Estab. %   Eos 4 Not Estab. %   Basos 1 Not Estab. %   Neutrophils Absolute 4.4 1.4 - 7.0 x10E3/uL   Lymphocytes Absolute 2.0 0.7 - 3.1 x10E3/uL   Monocytes Absolute 0.6 0.1 - 0.9 x10E3/uL   EOS (ABSOLUTE) 0.3 0.0 - 0.4 x10E3/uL   Basophils Absolute 0.1 0.0 - 0.2 x10E3/uL   Immature Granulocytes 0 Not Estab. %   Immature Grans (Abs) 0.0 0.0 - 0.1 x10E3/uL  CMP14+EGFR    Result Value Ref Range   Glucose 92 65 - 99 mg/dL   BUN 11 6 - 24 mg/dL   Creatinine, Ser 0.53 (L) 0.57 - 1.00 mg/dL   GFR calc non Af Amer 114 >59 mL/min/1.73   GFR calc Af Amer 132 >59 mL/min/1.73  BUN/Creatinine Ratio 21 9 - 23   Sodium 141 134 - 144 mmol/L   Potassium 4.0 3.5 - 5.2 mmol/L   Chloride 102 96 - 106 mmol/L   CO2 21 20 - 29 mmol/L   Calcium 9.9 8.7 - 10.2 mg/dL   Total Protein 7.7 6.0 - 8.5 g/dL   Albumin 5.0 (H) 3.8 - 4.8 g/dL   Globulin, Total 2.7 1.5 - 4.5 g/dL   Albumin/Globulin Ratio 1.9 1.2 - 2.2   Bilirubin Total 0.3 0.0 - 1.2 mg/dL   Alkaline Phosphatase 83 39 - 117 IU/L   AST 19 0 - 40 IU/L   ALT 19 0 - 32 IU/L  TSH  Result Value Ref Range   TSH 7.660 (H) 0.450 - 4.500 uIU/mL  Vitamin B12  Result Value Ref Range   Vitamin B-12 >2000 (H) 232 - 1245 pg/mL  Lyme Ab/Western Blot Reflex  Result Value Ref Range   Lyme IgG/IgM Ab <0.91 0.00 - 0.90 ISR   LYME DISEASE AB, QUANT, IGM <0.80 0.00 - 0.79 index  Alpha-Gal Panel  Result Value Ref Range   Beef (Bos spp) IgE 4.49 (H) <0.35 kU/L   Class Interpretation 3    Lamb/Mutton (Ovis spp) IgE 0.85 (H) <0.35 kU/L   Class Interpretation 2    Pork (Sus spp) IgE 2.22 (H) <0.35 kU/L   Class Interpretation 2    Alpha Gal IgE* 11.40 (H) <0.10 kU/L  Rocky mtn spotted fvr abs pnl(IgG+IgM)  Result Value Ref Range   RMSF IgG Negative Negative   RMSF IgM 0.28 0.00 - 0.89 index      Assessment & Plan:   1. Chronic fatigue - CBC with Differential/Platelet - TSH - CMP14+EGFR  2. Acquired hypothyroidism - CBC with Differential/Platelet - TSH - CMP14+EGFR   Continue all other maintenance medications as listed above.  Follow up plan: No follow-ups on file.  Educational handout given for [place] PA-C [facility] [address]  [place], [place] [address] [phone]   10/16/2018, 10:37 AM

## 2018-10-17 LAB — CMP14+EGFR
ALT: 17 IU/L (ref 0–32)
AST: 19 IU/L (ref 0–40)
Albumin/Globulin Ratio: 1.6 (ref 1.2–2.2)
Albumin: 4.7 g/dL (ref 3.8–4.8)
Alkaline Phosphatase: 94 IU/L (ref 39–117)
BUN/Creatinine Ratio: 18 (ref 9–23)
BUN: 12 mg/dL (ref 6–24)
Bilirubin Total: 0.4 mg/dL (ref 0.0–1.2)
CO2: 21 mmol/L (ref 20–29)
Calcium: 9.9 mg/dL (ref 8.7–10.2)
Chloride: 102 mmol/L (ref 96–106)
Creatinine, Ser: 0.65 mg/dL (ref 0.57–1.00)
GFR calc Af Amer: 122 mL/min/{1.73_m2} (ref >59)
GFR calc non Af Amer: 106 mL/min/{1.73_m2} (ref >59)
Globulin, Total: 3 g/dL (ref 1.5–4.5)
Glucose: 92 mg/dL (ref 65–99)
Potassium: 4.3 mmol/L (ref 3.5–5.2)
Sodium: 140 mmol/L (ref 134–144)
Total Protein: 7.7 g/dL (ref 6.0–8.5)

## 2018-10-17 LAB — CBC WITH DIFFERENTIAL/PLATELET
Basophils Absolute: 0 10*3/uL (ref 0.0–0.2)
Basos: 1 %
EOS (ABSOLUTE): 0.2 10*3/uL (ref 0.0–0.4)
Eos: 4 %
Hematocrit: 43.3 % (ref 34.0–46.6)
Hemoglobin: 14.6 g/dL (ref 11.1–15.9)
Immature Grans (Abs): 0 10*3/uL (ref 0.0–0.1)
Immature Granulocytes: 0 %
Lymphocytes Absolute: 1.6 10*3/uL (ref 0.7–3.1)
Lymphs: 28 %
MCH: 28.2 pg (ref 26.6–33.0)
MCHC: 33.7 g/dL (ref 31.5–35.7)
MCV: 84 fL (ref 79–97)
Monocytes Absolute: 0.5 10*3/uL (ref 0.1–0.9)
Monocytes: 8 %
Neutrophils Absolute: 3.4 10*3/uL (ref 1.4–7.0)
Neutrophils: 59 %
Platelets: 316 10*3/uL (ref 150–450)
RBC: 5.17 x10E6/uL (ref 3.77–5.28)
RDW: 13.1 % (ref 11.7–15.4)
WBC: 5.8 10*3/uL (ref 3.4–10.8)

## 2018-10-17 LAB — TSH: TSH: 0.499 u[IU]/mL (ref 0.450–4.500)

## 2018-10-19 ENCOUNTER — Telehealth: Payer: Self-pay | Admitting: *Deleted

## 2018-10-19 NOTE — Telephone Encounter (Signed)
Patient aware.

## 2018-10-19 NOTE — Telephone Encounter (Signed)
-----   Message from Terald Sleeper, [place] sent at 10/18/2018  5:47 PM EDT ----- We discussed her stopping the B12 injections for about a month to see if there was an ingredient in the extra parts included in that injection.  She can be trying that is if that helps.  Also we can refer her to neurology to have further work-up for her numbness and fatigue. ----- Message ----- From: Dannial Monarch, LPN Sent: 8/87/1959   3:05 PM EDT To: Terald Sleeper, PA-C  Pt aware of labs, Patient wants to know why she is so tired and having facial numbness since labs were normal.

## 2018-11-06 ENCOUNTER — Other Ambulatory Visit: Payer: Self-pay | Admitting: *Deleted

## 2018-11-06 MED ORDER — LEVOTHYROXINE SODIUM 75 MCG PO TABS: 75.0000 ug | ORAL_TABLET | Freq: Every day | ORAL | 4 refills | Status: DC

## 2018-11-21 ENCOUNTER — Other Ambulatory Visit: Payer: Self-pay | Admitting: *Deleted

## 2018-11-21 DIAGNOSIS — Z20822 Contact with and (suspected) exposure to covid-19: Secondary | ICD-10-CM

## 2018-11-22 LAB — NOVEL CORONAVIRUS, NAA: SARS-CoV-2, NAA: NOT DETECTED

## 2019-09-13 ENCOUNTER — Ambulatory Visit (INDEPENDENT_AMBULATORY_CARE_PROVIDER_SITE_OTHER): Payer: BC Managed Care – PPO

## 2019-09-13 ENCOUNTER — Other Ambulatory Visit: Payer: Self-pay

## 2019-09-13 ENCOUNTER — Ambulatory Visit: Payer: BC Managed Care – PPO | Admitting: Family Medicine

## 2019-09-13 ENCOUNTER — Encounter: Payer: Self-pay | Admitting: Family Medicine

## 2019-09-13 VITALS — BP 125/80 | HR 70 | Temp 98.0°F | Ht 61.0 in | Wt 179.6 lb

## 2019-09-13 DIAGNOSIS — E559 Vitamin D deficiency, unspecified: Secondary | ICD-10-CM

## 2019-09-13 DIAGNOSIS — M79672 Pain in left foot: Secondary | ICD-10-CM | POA: Diagnosis not present

## 2019-09-13 DIAGNOSIS — E039 Hypothyroidism, unspecified: Secondary | ICD-10-CM

## 2019-09-13 MED ORDER — DICLOFENAC SODIUM 75 MG PO TBEC: 75.0000 mg | DELAYED_RELEASE_TABLET | Freq: Two times a day (BID) | ORAL | 0 refills | Status: DC

## 2019-09-13 NOTE — Patient Instructions (Signed)
Foot Pain Many things can cause foot pain. Some common causes are:  An injury.  A sprain.  Arthritis.  Blisters.  Bunions. Follow these instructions at home: Managing pain, stiffness, and swelling If directed, put ice on the painful area:  Put ice in a plastic bag.  Place a towel between your skin and the bag.  Leave the ice on for 20 minutes, 2-3 times a day.  Activity  Do not stand or walk for long periods.  Return to your normal activities as told by your health care provider. Ask your health care provider what activities are safe for you.  Do stretches to relieve foot pain and stiffness as told by your health care provider.  Do not lift anything that is heavier than 10 lb (4.5 kg), or the limit that you are told, until your health care provider says that it is safe. Lifting a lot of weight can put added pressure on your feet. Lifestyle  Wear comfortable, supportive shoes that fit you well. Do not wear high heels.  Keep your feet clean and dry. General instructions  Take over-the-counter and prescription medicines only as told by your health care provider.  Rub your foot gently.  Pay attention to any changes in your symptoms.  Keep all follow-up visits as told by your health care provider. This is important. Contact a health care provider if:  Your pain does not get better after a few days of self-care.  Your pain gets worse.  You cannot stand on your foot. Get help right away if:  Your foot is numb or tingling.  Your foot or toes are swollen.  Your foot or toes turn white or blue.  You have warmth and redness along your foot. Summary  Common causes of foot pain are injury, sprain, arthritis, blisters or bunions.  Ice, medicines, and comfortable shoes may help foot pain.  Contact your health care provider if your pain does not get better after a few days of self-care. This information is not intended to replace advice given to you by your health  care provider. Make sure you discuss any questions you have with your health care provider. Document Revised: 10/20/2017 Document Reviewed: 10/20/2017 Elsevier Patient Education  [place].

## 2019-09-13 NOTE — Progress Notes (Signed)
Assessment & Plan:  1. Left foot pain - Improving. Education provided on foot pain.  - DG Foot Complete Left - diclofenac (VOLTAREN) 75 MG EC tablet; Take 1 tablet (75 mg total) by mouth 2 (two) times daily.  Dispense: 30 tablet; Refill: 0 - CBC with Differential/Platelet - Uric acid - C-reactive protein  2. Acquired hypothyroidism - CMP14+EGFR - TSH  3. Vitamin D deficiency - VITAMIN D 25 Hydroxy (Vit-D Deficiency, Fractures)   Follow up plan: Return if symptoms worsen or fail to improve.  Hendricks Limes, MSN, APRN, FNP-C [facility]  Subjective:   Patient ID: Katherine Castro, female    DOB: 1970-11-25, 49 y.o.   MRN: [id number]  HPI: Katherine Castro is a 49 y.o. female presenting on 09/13/2019 for left foot pain (x 2 days)  Patient reports left foot pain that woke her from sleep at its onset at 2 AM two days ago. The pain is mostly in and just below her last two toes. She felt it was slightly bruised initially but is unaware of any injury. She was unable to bear weight but has gradually been able to bear more. She has been applying ice and elevating the foot. It does feel some better if she has on hard/supportive shoes. She feels it is 50-60% better today than when it started.    ROS: Negative unless specifically indicated above in HPI.   Relevant past medical history reviewed and updated as indicated.   Allergies and medications reviewed and updated.   Current Outpatient Medications:  .  levothyroxine (SYNTHROID) 75 MCG tablet, Take 1 tablet (75 mcg total) by mouth daily., Disp: 90 tablet, Rfl: 4 .  Vitamin D, Ergocalciferol, (DRISDOL) 50000 units CAPS capsule, Take 1 capsule (50,000 Units total) by mouth every 7 (seven) days., Disp: 12 capsule, Rfl: 3  Allergies  Allergen Reactions  . Bee Venom Swelling    Cellulitis around site  . Penicillins Rash    Has patient had a PCN reaction causing immediate rash, facial/tongue/throat swelling,  SOB or lightheadedness with hypotension: yes Has patient had a PCN reaction causing severe rash involving mucus membranes or skin necrosis: no Has patient had a PCN reaction that required hospitalization: no Has patient had a PCN reaction occurring within the last 10 years:  no If all of the above answers are "NO", then may proceed with Cephalosporin use.     Objective:   BP 125/80   Pulse 70   Temp 98 F (36.7 C) (Temporal)   Ht _0  (1.549 m)   Wt 179 lb 9.6 oz (81.5 kg)   SpO2 96%   BMI 33.94 kg/m    Physical Exam Vitals reviewed.  Constitutional:      General: She is not in acute distress.    Appearance: Normal appearance. She is not ill-appearing, toxic-appearing or diaphoretic.  HENT:     Head: Normocephalic and atraumatic.  Eyes:     General: No scleral icterus.       Right eye: No discharge.        Left eye: No discharge.     Conjunctiva/sclera: Conjunctivae normal.  Cardiovascular:     Rate and Rhythm: Normal rate.  Pulmonary:     Effort: Pulmonary effort is normal. No respiratory distress.  Musculoskeletal:        General: Normal range of motion.     Cervical back: Normal range of motion.     Left foot: Normal range of motion and normal  capillary refill. Swelling (just below last two toes on ball of foot) and tenderness (just below last two toes on ball of foot) present. No deformity, laceration, bony tenderness or crepitus. Normal pulse.  Skin:    General: Skin is warm and dry.     Capillary Refill: Capillary refill takes less than 2 seconds.  Neurological:     General: No focal deficit present.     Mental Status: She is alert and oriented to person, place, and time. Mental status is at baseline.  Psychiatric:        Mood and Affect: Mood normal.        Behavior: Behavior normal.        Thought Content: Thought content normal.        Judgment: Judgment normal.

## 2019-09-14 ENCOUNTER — Encounter: Payer: Self-pay | Admitting: Family Medicine

## 2019-09-14 LAB — CMP14+EGFR
ALT: 21 IU/L (ref 0–32)
AST: 20 IU/L (ref 0–40)
Albumin/Globulin Ratio: 1.3 (ref 1.2–2.2)
Albumin: 4.4 g/dL (ref 3.8–4.8)
Alkaline Phosphatase: 108 IU/L (ref 48–121)
BUN/Creatinine Ratio: 15 (ref 9–23)
BUN: 10 mg/dL (ref 6–24)
Bilirubin Total: 0.4 mg/dL (ref 0.0–1.2)
CO2: 25 mmol/L (ref 20–29)
Calcium: 9.6 mg/dL (ref 8.7–10.2)
Chloride: 103 mmol/L (ref 96–106)
Creatinine, Ser: 0.65 mg/dL (ref 0.57–1.00)
GFR calc Af Amer: 121 mL/min/{1.73_m2} (ref >59)
GFR calc non Af Amer: 105 mL/min/{1.73_m2} (ref >59)
Globulin, Total: 3.3 g/dL (ref 1.5–4.5)
Glucose: 97 mg/dL (ref 65–99)
Potassium: 4.2 mmol/L (ref 3.5–5.2)
Sodium: 140 mmol/L (ref 134–144)
Total Protein: 7.7 g/dL (ref 6.0–8.5)

## 2019-09-14 LAB — CBC WITH DIFFERENTIAL/PLATELET
Basophils Absolute: 0 10*3/uL (ref 0.0–0.2)
Basos: 1 %
EOS (ABSOLUTE): 0.3 10*3/uL (ref 0.0–0.4)
Eos: 4 %
Hematocrit: 44.4 % (ref 34.0–46.6)
Hemoglobin: 14.3 g/dL (ref 11.1–15.9)
Immature Grans (Abs): 0 10*3/uL (ref 0.0–0.1)
Immature Granulocytes: 0 %
Lymphocytes Absolute: 1.9 10*3/uL (ref 0.7–3.1)
Lymphs: 27 %
MCH: 28 pg (ref 26.6–33.0)
MCHC: 32.2 g/dL (ref 31.5–35.7)
MCV: 87 fL (ref 79–97)
Monocytes Absolute: 0.5 10*3/uL (ref 0.1–0.9)
Monocytes: 8 %
Neutrophils Absolute: 4.2 10*3/uL (ref 1.4–7.0)
Neutrophils: 60 %
Platelets: 320 10*3/uL (ref 150–450)
RBC: 5.1 x10E6/uL (ref 3.77–5.28)
RDW: 13.2 % (ref 11.7–15.4)
WBC: 6.9 10*3/uL (ref 3.4–10.8)

## 2019-09-14 LAB — TSH: TSH: 1.71 u[IU]/mL (ref 0.450–4.500)

## 2019-09-14 LAB — C-REACTIVE PROTEIN: CRP: 16 mg/L — ABNORMAL HIGH (ref 0–10)

## 2019-09-14 LAB — VITAMIN D 25 HYDROXY (VIT D DEFICIENCY, FRACTURES): Vit D, 25-Hydroxy: 19.3 ng/mL — ABNORMAL LOW (ref 30.0–100.0)

## 2019-09-14 LAB — URIC ACID: Uric Acid: 4.4 mg/dL (ref 2.6–6.2)

## 2019-09-14 MED ORDER — VITAMIN D (ERGOCALCIFEROL) 1.25 MG (50000 UNIT) PO CAPS: 50000.0000 [IU] | ORAL_CAPSULE | ORAL | 1 refills | Status: DC

## 2019-09-14 NOTE — Telephone Encounter (Signed)
Per labs she is out of VIt D

## 2019-11-15 ENCOUNTER — Other Ambulatory Visit: Payer: Self-pay | Admitting: Family

## 2020-05-07 ENCOUNTER — Encounter: Payer: Self-pay | Admitting: Family Medicine

## 2020-05-07 ENCOUNTER — Ambulatory Visit: Payer: BC Managed Care – PPO | Admitting: Family Medicine

## 2020-05-07 ENCOUNTER — Other Ambulatory Visit: Payer: Self-pay

## 2020-05-07 VITALS — BP 109/70 | HR 70 | Temp 97.9°F | Ht 61.0 in | Wt 177.0 lb

## 2020-05-07 DIAGNOSIS — M545 Low back pain, unspecified: Secondary | ICD-10-CM | POA: Diagnosis not present

## 2020-05-07 DIAGNOSIS — R21 Rash and other nonspecific skin eruption: Secondary | ICD-10-CM

## 2020-05-07 DIAGNOSIS — R109 Unspecified abdominal pain: Secondary | ICD-10-CM

## 2020-05-07 LAB — URINALYSIS, ROUTINE W REFLEX MICROSCOPIC
Bilirubin, UA: NEGATIVE
Glucose, UA: NEGATIVE
Ketones, UA: NEGATIVE
Leukocytes,UA: NEGATIVE
Nitrite, UA: NEGATIVE
Protein,UA: NEGATIVE
RBC, UA: NEGATIVE
Specific Gravity, UA: 1.02 (ref 1.005–1.030)
Urobilinogen, Ur: 0.2 mg/dL (ref 0.2–1.0)
pH, UA: 5 (ref 5.0–7.5)

## 2020-05-07 MED ORDER — NYSTATIN 100000 UNIT/GM EX CREA: 1.0000 "application " | TOPICAL_CREAM | Freq: Two times a day (BID) | CUTANEOUS | 0 refills | Status: DC

## 2020-05-07 NOTE — Progress Notes (Signed)
Acute Office Visit  Subjective:    Patient ID: Katherine Castro, female    DOB: Jun 11, 1970, 50 y.o.   MRN: [id number]  Chief Complaint  Patient presents with  . Back Pain  . Rash    HPI Patient is in today for low left sided back pain for a month. The pain is an 5-8/10. The pain is sharp at times and achy at other. She reports that she has difficulty sleeping at night because of the pain. Sometimes siting or She denies pain that ratidates down her leg. Denies changes in gait, balance, changes in bowel or bladder control. She has tried her hot tub, warm bath, and heating pad with some relief. Denies Pain has been a little better today.   Katherine Castro also reports a rash on her left chin since having to wear a mask for work. It rash is occasionally itchy, but not often. It is red and bumpy. She has tried washubg her face more frequently with mild soaps, hydrocortione cream, and multiple acne cream and soaps without improvement. She is established with dermatology.   Past Medical History:  Diagnosis Date  . Thyroid disease     Past Surgical History:  Procedure Laterality Date  . ABDOMINAL HYSTERECTOMY      Family History  Problem Relation Age of Onset  . Diabetes Father   . Hypertension Father   . Hyperlipidemia Father   . Diabetes Paternal Grandmother   . Hyperlipidemia Paternal Grandmother   . Hypertension Paternal Grandmother     Social History   Socioeconomic History  . Marital status: Married    Spouse name: Not on file  . Number of children: Not on file  . Years of education: Not on file  . Highest education level: Not on file  Occupational History  . Not on file  Tobacco Use  . Smoking status: Never Smoker  . Smokeless tobacco: Never Used  Vaping Use  . Vaping Use: Never used  Substance and Sexual Activity  . Alcohol use: No  . Drug use: No  . Sexual activity: Not on file  Other Topics Concern  . Not on file  Social History Narrative  . Not on file    Social Determinants of Health   Financial Resource Strain: Not on file  Food Insecurity: Not on file  Transportation Needs: Not on file  Physical Activity: Not on file  Stress: Not on file  Social Connections: Not on file  Intimate Partner Violence: Not on file    Outpatient Medications Prior to Visit  Medication Sig Dispense Refill  . levothyroxine (SYNTHROID) 75 MCG tablet TAKE 1 TABLET BY MOUTH  DAILY 90 tablet 3  . diclofenac (VOLTAREN) 75 MG EC tablet Take 1 tablet (75 mg total) by mouth 2 (two) times daily. 30 tablet 0  . Vitamin D, Ergocalciferol, (DRISDOL) 1.25 MG (50000 UNIT) CAPS capsule Take 1 capsule (50,000 Units total) by mouth every 7 (seven) days. 12 capsule 1   No facility-administered medications prior to visit.    Allergies  Allergen Reactions  . Bee Venom Swelling    Cellulitis around site  . Penicillins Rash    Has patient had a PCN reaction causing immediate rash, facial/tongue/throat swelling, SOB or lightheadedness with hypotension: yes Has patient had a PCN reaction causing severe rash involving mucus membranes or skin necrosis: no Has patient had a PCN reaction that required hospitalization: no Has patient had a PCN reaction occurring within the last 10 years:  no If  all of the above answers are "NO", then may proceed with Cephalosporin use.     Review of Systems As per HPI.     Objective:    Physical Exam Vitals and nursing note reviewed.  Constitutional:      General: She is not in acute distress.    Appearance: She is not ill-appearing, toxic-appearing or diaphoretic.  HENT:     Head: Normocephalic and atraumatic.  Musculoskeletal:     Lumbar back: Tenderness (lower left sided paraspinal tenderness) present. No swelling, edema, deformity or bony tenderness. Normal range of motion. Negative right straight leg raise test and negative left straight leg raise test.  Skin:    General: Skin is warm and dry.     Findings: Rash present. Rash  is papular.     Comments: Red papular rash to left chin. No drainage or crusting noting. No surround erythema. No tenderness.   Neurological:     Mental Status: She is alert.     BP 109/70   Pulse 70   Temp 97.9 F (36.6 C) (Temporal)   Ht 5' 1"  (1.549 m)   Wt 177 lb (80.3 kg)   BMI 33.44 kg/m  Wt Readings from Last 3 Encounters:  05/07/20 177 lb (80.3 kg)  09/13/19 179 lb 9.6 oz (81.5 kg)  10/16/18 156 lb 3.2 oz (70.9 kg)    Health Maintenance Due  Topic Date Due  . Hepatitis C Screening  Never done  . COVID-19 Vaccine (1) Never done  . MAMMOGRAM  Never done  . COLONOSCOPY (Pts 45-46yr Insurance coverage will need to be confirmed)  Never done  . PAP SMEAR-Modifier  06/23/2017    There are no preventive care reminders to display for this patient.   Lab Results  Component Value Date   TSH 1.710 09/13/2019   Lab Results  Component Value Date   WBC 6.9 09/13/2019   HGB 14.3 09/13/2019   HCT 44.4 09/13/2019   MCV 87 09/13/2019   PLT 320 09/13/2019   Lab Results  Component Value Date   NA 140 09/13/2019   K 4.2 09/13/2019   CO2 25 09/13/2019   GLUCOSE 97 09/13/2019   BUN 10 09/13/2019   CREATININE 0.65 09/13/2019   BILITOT 0.4 09/13/2019   ALKPHOS 108 09/13/2019   AST 20 09/13/2019   ALT 21 09/13/2019   PROT 7.7 09/13/2019   ALBUMIN 4.4 09/13/2019   CALCIUM 9.6 09/13/2019   ANIONGAP 9 06/14/2016   Lab Results  Component Value Date   CHOL 199 04/28/2017   Lab Results  Component Value Date   HDL 60 04/28/2017   Lab Results  Component Value Date   LDLCALC 125 (H) 04/28/2017   Lab Results  Component Value Date   TRIG 71 04/28/2017   Lab Results  Component Value Date   CHOLHDL 3.3 04/28/2017   No results found for: HGBA1C     Assessment & Plan:   MMattisenwas seen today for back pain and rash.  Diagnoses and all orders for this visit:  Acute left-sided low back pain without sciatica UA negative. Likely muscular strain, discussed heat,  ice, tylenol, advil as needed. Patient declined PT today.  -     Urinalysis, Routine w reflex microscopic  Rash Unsure of etiology. Has failed hydrocortisone cream and acne treatments. Try nystatin as below. If no improvement, patient will follow up with dermatology.  -     nystatin cream (MYCOSTATIN); Apply 1 application topically 2 (two) times daily.  Return to office for new or worsening symptoms, or if symptoms persist.   The patient indicates understanding of these issues and agrees with the plan.  Gwenlyn Perking, FNP

## 2020-05-07 NOTE — Patient Instructions (Addendum)
Lumbar Strain A lumbar strain, which is sometimes called a low-back strain, is a stretch or tear in a muscle or the strong cords of tissue that attach muscle to bone (tendons) in the lower back (lumbar spine). This type of injury occurs when muscles or tendons are torn or are stretched beyond their limits. Lumbar strains can range from mild to severe. Mild strains may involve stretching a muscle or tendon without tearing it. These may heal in 1-2 weeks. More severe strains involve tearing of muscle fibers or tendons. These will cause more pain and may take 6-8 weeks to heal. What are the causes? This condition may be caused by:  Trauma, such as a fall or a hit to the body.  Twisting or overstretching the back. This may result from doing activities that need a lot of energy, such as lifting heavy objects. What increases the risk? This injury is more common in:  Athletes.  People with obesity.  People who do repeated lifting, bending, or other movements that involve their back. What are the signs or symptoms? Symptoms of this condition may include:  Sharp or dull pain in the lower back that does not go away. The pain may extend to the buttocks.  Stiffness or limited range of motion.  Sudden muscle tightening (spasms). How is this diagnosed? This condition may be diagnosed based on:  Your symptoms.  Your medical history.  A physical exam.  Imaging tests, such as: ? X-rays. ? MRI. How is this treated? Treatment for this condition may include:  Rest.  Applying heat and cold to the affected area.  Over-the-counter medicines to help relieve pain and inflammation, such as NSAIDs.  Prescription pain medicine and muscle relaxants may be needed for a short time.  Physical therapy. Follow these instructions at home: Managing pain, stiffness, and swelling  If directed, put ice on the injured area during the first 24 hours after your injury. ? Put ice in a plastic bag. ? Place  a towel between your skin and the bag. ? Leave the ice on for 20 minutes, 2-3 times a day.  If directed, apply heat to the affected area as often as told by your health care provider. Use the heat source that your health care provider recommends, such as a moist heat pack or a heating pad. ? Place a towel between your skin and the heat source. ? Leave the heat on for 20-30 minutes. ? Remove the heat if your skin turns bright red. This is especially important if you are unable to feel pain, heat, or cold. You may have a greater risk of getting burned.      Activity  Rest and return to your normal activities as told by your health care provider. Ask your health care provider what activities are safe for you.  Do exercises as told by your health care provider. Medicines  Take over-the-counter and prescription medicines only as told by your health care provider.  Ask your health care provider if the medicine prescribed to you: ? Requires you to avoid driving or using heavy machinery. ? Can cause constipation. You may need to take these actions to prevent or treat constipation:  Drink enough fluid to keep your urine pale yellow.  Take over-the-counter or prescription medicines.  Eat foods that are high in fiber, such as beans, whole grains, and fresh fruits and vegetables.  Limit foods that are high in fat and processed sugars, such as fried or sweet foods. Injury prevention To  prevent a future low-back injury:  Always warm up properly before physical activity or sports.  Cool down and stretch after being active.  Use correct form when playing sports and lifting heavy objects. Bend your knees before you lift heavy objects.  Use good posture when sitting and standing.  Stay physically fit and keep a healthy weight. ? Do at least 150 minutes of moderate-intensity exercise each week, such as brisk walking or water aerobics. ? Do strength exercises at least 2 times each week.    General instructions  Do not use any products that contain nicotine or tobacco, such as cigarettes, e-cigarettes, and chewing tobacco. If you need help quitting, ask your health care provider.  Keep all follow-up visits as told by your health care provider. This is important. Contact a health care provider if:  Your back pain does not improve after 6 weeks of treatment.  Your symptoms get worse. Get help right away if:  Your back pain is severe.  You are unable to stand or walk.  You develop pain in your legs.  You develop weakness in your buttocks or legs.  You have difficulty controlling when you urinate or when you have a bowel movement. ? You have frequent, painful, or bloody urination. ? You have a temperature over 101.76F (38.3C) Summary  A lumbar strain, which is sometimes called a low-back strain, is a stretch or tear in a muscle or the strong cords of tissue that attach muscle to bone (tendons) in the lower back (lumbar spine).  This type of injury occurs when muscles or tendons are torn or are stretched beyond their limits.  Rest and return to your normal activities as told by your health care provider. If directed, apply heat and ice to the affected area as often as told by your health care provider.  Take over-the-counter and prescription medicines only as told by your health care provider.  Contact a health care provider if you have new or worsening symptoms. This information is not intended to replace advice given to you by your health care provider. Make sure you discuss any questions you have with your health care provider. Document Revised: 11/03/2017 Document Reviewed: 11/03/2017 Elsevier Patient Education  2021 [place].    Back Exercises The following exercises strengthen the muscles that help to support the trunk and back. They also help to keep the lower back flexible. Doing these exercises can help to prevent back pain or lessen existing  pain.  If you have back pain or discomfort, try doing these exercises 2-3 times each day or as told by your health care provider.  As your pain improves, do them once each day, but increase the number of times that you repeat the steps for each exercise (do more repetitions).  To prevent the recurrence of back pain, continue to do these exercises once each day or as told by your health care provider. Do exercises exactly as told by your health care provider and adjust them as directed. It is normal to feel mild stretching, pulling, tightness, or discomfort as you do these exercises, but you should stop right away if you feel sudden pain or your pain gets worse. Exercises Single knee to chest Repeat these steps 3-5 times for each leg: 1. Lie on your back on a firm bed or the floor with your legs extended. 2. Bring one knee to your chest. Your other leg should stay extended and in contact with the floor. 3. Hold your knee in  place by grabbing your knee or thigh with both hands and hold. 4. Pull on your knee until you feel a gentle stretch in your lower back or buttocks. 5. Hold the stretch for 10-30 seconds. 6. Slowly release and straighten your leg. Pelvic tilt Repeat these steps 5-10 times: 1. Lie on your back on a firm bed or the floor with your legs extended. 2. Bend your knees so they are pointing toward the ceiling and your feet are flat on the floor. 3. Tighten your lower abdominal muscles to press your lower back against the floor. This motion will tilt your pelvis so your tailbone points up toward the ceiling instead of pointing to your feet or the floor. 4. With gentle tension and even breathing, hold this position for 5-10 seconds. Cat-cow Repeat these steps until your lower back becomes more flexible: 1. Get into a hands-and-knees position on a firm surface. Keep your hands under your shoulders, and keep your knees under your hips. You may place padding under your knees for  comfort. 2. Let your head hang down toward your chest. Contract your abdominal muscles and point your tailbone toward the floor so your lower back becomes rounded like the back of a cat. 3. Hold this position for 5 seconds. 4. Slowly lift your head, let your abdominal muscles relax and point your tailbone up toward the ceiling so your back forms a sagging arch like the back of a cow. 5. Hold this position for 5 seconds.   Press-ups Repeat these steps 5-10 times: 1. Lie on your abdomen (face-down) on the floor. 2. Place your palms near your head, about shoulder-width apart. 3. Keeping your back as relaxed as possible and keeping your hips on the floor, slowly straighten your arms to raise the top half of your body and lift your shoulders. Do not use your back muscles to raise your upper torso. You may adjust the placement of your hands to make yourself more comfortable. 4. Hold this position for 5 seconds while you keep your back relaxed. 5. Slowly return to lying flat on the floor.   Bridges Repeat these steps 10 times: 1. Lie on your back on a firm surface. 2. Bend your knees so they are pointing toward the ceiling and your feet are flat on the floor. Your arms should be flat at your sides, next to your body. 3. Tighten your buttocks muscles and lift your buttocks off the floor until your waist is at almost the same height as your knees. You should feel the muscles working in your buttocks and the back of your thighs. If you do not feel these muscles, slide your feet 1-2 inches farther away from your buttocks. 4. Hold this position for 3-5 seconds. 5. Slowly lower your hips to the starting position, and allow your buttocks muscles to relax completely. If this exercise is too easy, try doing it with your arms crossed over your chest.   Abdominal crunches Repeat these steps 5-10 times: 1. Lie on your back on a firm bed or the floor with your legs extended. 2. Bend your knees so they are  pointing toward the ceiling and your feet are flat on the floor. 3. Cross your arms over your chest. 4. Tip your chin slightly toward your chest without bending your neck. 5. Tighten your abdominal muscles and slowly raise your trunk (torso) high enough to lift your shoulder blades a tiny bit off the floor. Avoid raising your torso higher than that because  it can put too much stress on your low back and does not help to strengthen your abdominal muscles. 6. Slowly return to your starting position. Back lifts Repeat these steps 5-10 times: 1. Lie on your abdomen (face-down) with your arms at your sides, and rest your forehead on the floor. 2. Tighten the muscles in your legs and your buttocks. 3. Slowly lift your chest off the floor while you keep your hips pressed to the floor. Keep the back of your head in line with the curve in your back. Your eyes should be looking at the floor. 4. Hold this position for 3-5 seconds. 5. Slowly return to your starting position. Contact a health care provider if:  Your back pain or discomfort gets much worse when you do an exercise.  Your worsening back pain or discomfort does not lessen within 2 hours after you exercise. If you have any of these problems, stop doing these exercises right away. Do not do them again unless your health care provider says that you can. Get help right away if:  You develop sudden, severe back pain. If this happens, stop doing the exercises right away. Do not do them again unless your health care provider says that you can. This information is not intended to replace advice given to you by your health care provider. Make sure you discuss any questions you have with your health care provider. Document Revised: 05/11/2018 Document Reviewed: 10/06/2017 Elsevier Patient Education  [place].

## 2020-05-20 ENCOUNTER — Encounter: Payer: Self-pay | Admitting: Family Medicine

## 2020-05-26 ENCOUNTER — Encounter: Payer: Self-pay | Admitting: Family

## 2020-05-26 ENCOUNTER — Ambulatory Visit: Payer: BC Managed Care – PPO | Admitting: Family

## 2020-05-26 ENCOUNTER — Other Ambulatory Visit: Payer: Self-pay

## 2020-05-26 VITALS — BP 139/83 | HR 60 | Temp 98.1°F | Ht 61.0 in | Wt 177.0 lb

## 2020-05-26 DIAGNOSIS — Z0000 Encounter for general adult medical examination without abnormal findings: Secondary | ICD-10-CM

## 2020-05-26 DIAGNOSIS — E8881 Metabolic syndrome: Secondary | ICD-10-CM | POA: Diagnosis not present

## 2020-05-26 DIAGNOSIS — Z0001 Encounter for general adult medical examination with abnormal findings: Secondary | ICD-10-CM

## 2020-05-26 DIAGNOSIS — Z1159 Encounter for screening for other viral diseases: Secondary | ICD-10-CM

## 2020-05-26 DIAGNOSIS — E039 Hypothyroidism, unspecified: Secondary | ICD-10-CM | POA: Diagnosis not present

## 2020-05-26 DIAGNOSIS — E669 Obesity, unspecified: Secondary | ICD-10-CM | POA: Diagnosis not present

## 2020-05-26 DIAGNOSIS — E559 Vitamin D deficiency, unspecified: Secondary | ICD-10-CM

## 2020-05-26 MED ORDER — OZEMPIC (0.25 OR 0.5 MG/DOSE) 2 MG/1.5ML ~~LOC~~ SOPN: PEN_INJECTOR | SUBCUTANEOUS | 2 refills | Status: DC

## 2020-05-26 NOTE — Progress Notes (Signed)
Subjective:    Patient ID: Katherine Castro, female    DOB: 10-Nov-1970, 50 y.o.   MRN: [id number]  Chief Complaint  Patient presents with  . Medical Management of Chronic Issues    Blood work fasting    Pt presents to the office today for CPE without pap. Pt requesting medication for weight loss.   Thyroid Problem Presents for follow-up visit. Symptoms include dry skin. Patient reports no anxiety, diarrhea, hair loss or hoarse voice. The symptoms have been stable.      Review of Systems  HENT: Negative for hoarse voice.   Gastrointestinal: Negative for diarrhea.  Psychiatric/Behavioral: The patient is not nervous/anxious.   All other systems reviewed and are negative.  Family History  Problem Relation Age of Onset  . Diabetes Father   . Hypertension Father   . Hyperlipidemia Father   . Diabetes Paternal Grandmother   . Hyperlipidemia Paternal Grandmother   . Hypertension Paternal Grandmother    Social History   Socioeconomic History  . Marital status: Married    Spouse name: Not on file  . Number of children: Not on file  . Years of education: Not on file  . Highest education level: Not on file  Occupational History  . Not on file  Tobacco Use  . Smoking status: Never Smoker  . Smokeless tobacco: Never Used  Vaping Use  . Vaping Use: Never used  Substance and Sexual Activity  . Alcohol use: No  . Drug use: No  . Sexual activity: Not on file  Other Topics Concern  . Not on file  Social History Narrative  . Not on file   Social Determinants of Health   Financial Resource Strain: Not on file  Food Insecurity: Not on file  Transportation Needs: Not on file  Physical Activity: Not on file  Stress: Not on file  Social Connections: Not on file       Objective:   Physical Exam Vitals reviewed.  Constitutional:      General: She is not in acute distress.    Appearance: She is well-developed. She is obese.  HENT:     Head: Normocephalic and  atraumatic.     Right Ear: Tympanic membrane normal.     Left Ear: Tympanic membrane normal.  Eyes:     Pupils: Pupils are equal, round, and reactive to light.  Neck:     Thyroid: No thyromegaly.  Cardiovascular:     Rate and Rhythm: Normal rate and regular rhythm.     Heart sounds: Normal heart sounds. No murmur heard.   Pulmonary:     Effort: Pulmonary effort is normal. No respiratory distress.     Breath sounds: Normal breath sounds. No wheezing.  Abdominal:     General: Bowel sounds are normal. There is no distension.     Palpations: Abdomen is soft.     Tenderness: There is no abdominal tenderness.  Musculoskeletal:        General: No tenderness. Normal range of motion.     Cervical back: Normal range of motion and neck supple.  Skin:    General: Skin is warm and dry.  Neurological:     Mental Status: She is alert and oriented to person, place, and time.     Cranial Nerves: No cranial nerve deficit.     Deep Tendon Reflexes: Reflexes are normal and symmetric.  Psychiatric:        Behavior: Behavior normal.  Thought Content: Thought content normal.        Judgment: Judgment normal.       BP 139/83   Pulse 60   Temp 98.1 F (36.7 C) (Temporal)   Ht 5' 1"  (1.549 m)   Wt 177 lb (80.3 kg)   BMI 33.44 kg/m      Assessment & Plan:  Katherine Castro comes in today with chief complaint of Medical Management of Chronic Issues (Blood work fasting/)   Diagnosis and orders addressed:  1. Annual physical exam - Cologuard - Hepatitis C antibody - CMP14+EGFR - CBC with Differential/Platelet - Lipid panel - TSH  2. Acquired hypothyroidism - CMP14+EGFR - CBC with Differential/Platelet  3. Vitamin D deficiency - CMP14+EGFR - CBC with Differential/Platelet - VITAMIN D 25 Hydroxy (Vit-D Deficiency, Fractures)  4. Obesity (BMI 30-39.9) - CMP14+EGFR - CBC with Differential/Platelet - Semaglutide,0.25 or 0.5MG/DOS, (OZEMPIC, 0.25 OR 0.5 MG/DOSE,) 2  MG/1.5ML SOPN; Inject 0.25 mg into the muscle once a week for 28 days, THEN 0.5 mg once a week for 28 days.  Dispense: 9 mL; Refill: 2  5. Need for hepatitis C screening test - Hepatitis C antibody - CMP14+EGFR - CBC with Differential/Platelet  6. Metabolic syndrome -Will start Ozempic today  Possible side effects  - Semaglutide,0.25 or 0.5MG/DOS, (OZEMPIC, 0.25 OR 0.5 MG/DOSE,) 2 MG/1.5ML SOPN; Inject 0.25 mg into the muscle once a week for 28 days, THEN 0.5 mg once a week for 28 days.  Dispense: 9 mL; Refill: 2   Labs pending Health Maintenance reviewed Diet and exercise encouraged  Follow up plan: 1 year    Evelina Dun, FNP

## 2020-05-26 NOTE — Patient Instructions (Signed)
Calorie Counting for Weight Loss Calories are units of energy. Your body needs a certain number of calories from food to keep going throughout the day. When you eat or drink more calories than your body needs, your body stores the extra calories mostly as fat. When you eat or drink fewer calories than your body needs, your body burns fat to get the energy it needs. Calorie counting means keeping track of how many calories you eat and drink each day. Calorie counting can be helpful if you need to lose weight. If you eat fewer calories than your body needs, you should lose weight. Ask your health care provider what a healthy weight is for you. For calorie counting to work, you will need to eat the right number of calories each day to lose a healthy amount of weight per week. A dietitian can help you figure out how many calories you need in a day and will suggest ways to reach your calorie goal.  A healthy amount of weight to lose each week is usually 1-2 lb (0.5-0.9 kg). This usually means that your daily calorie intake should be reduced by 500-750 calories.  Eating 1,200-1,500 calories a day can help most women lose weight.  Eating 1,500-1,800 calories a day can help most men lose weight. What do I need to know about calorie counting? Work with your health care provider or dietitian to determine how many calories you should get each day. To meet your daily calorie goal, you will need to:  Find out how many calories are in each food that you would like to eat. Try to do this before you eat.  Decide how much of the food you plan to eat.  Keep a food log. Do this by writing down what you ate and how many calories it had. To successfully lose weight, it is important to balance calorie counting with a healthy lifestyle that includes regular activity. Where do I find calorie information? The number of calories in a food can be found on a Nutrition Facts label. If a food does not have a Nutrition Facts  label, try to look up the calories online or ask your dietitian for help. Remember that calories are listed per serving. If you choose to have more than one serving of a food, you will have to multiply the calories per serving by the number of servings you plan to eat. For example, the label on a package of bread might say that a serving size is 1 slice and that there are 90 calories in a serving. If you eat 1 slice, you will have eaten 90 calories. If you eat 2 slices, you will have eaten 180 calories.   How do I keep a food log? After each time that you eat, record the following in your food log as soon as possible:  What you ate. Be sure to include toppings, sauces, and other extras on the food.  How much you ate. This can be measured in cups, ounces, or number of items.  How many calories were in each food and drink.  The total number of calories in the food you ate. Keep your food log near you, such as in a pocket-sized notebook or on an app or website on your mobile phone. Some programs will calculate calories for you and show you how many calories you have left to meet your daily goal. What are some portion-control tips?  Know how many calories are in a serving. This will  help you know how many servings you can have of a certain food.  Use a measuring cup to measure serving sizes. You could also try weighing out portions on a kitchen scale. With time, you will be able to estimate serving sizes for some foods.  Take time to put servings of different foods on your favorite plates or in your favorite bowls and cups so you know what a serving looks like.  Try not to eat straight from a food's packaging, such as from a bag or box. Eating straight from the package makes it hard to see how much you are eating and can lead to overeating. Put the amount you would like to eat in a cup or on a plate to make sure you are eating the right portion.  Use smaller plates, glasses, and bowls for smaller  portions and to prevent overeating.  Try not to multitask. For example, avoid watching TV or using your computer while eating. If it is time to eat, sit down at a table and enjoy your food. This will help you recognize when you are full. It will also help you be more mindful of what and how much you are eating. What are tips for following this plan? Reading food labels  Check the calorie count compared with the serving size. The serving size may be smaller than what you are used to eating.  Check the source of the calories. Try to choose foods that are high in protein, fiber, and vitamins, and low in saturated fat, trans fat, and sodium. Shopping  Read nutrition labels while you shop. This will help you make healthy decisions about which foods to buy.  Pay attention to nutrition labels for low-fat or fat-free foods. These foods sometimes have the same number of calories or more calories than the full-fat versions. They also often have added sugar, starch, or salt to make up for flavor that was removed with the fat.  Make a grocery list of lower-calorie foods and stick to it. Cooking  Try to cook your favorite foods in a healthier way. For example, try baking instead of frying.  Use low-fat dairy products. Meal planning  Use more fruits and vegetables. One-half of your plate should be fruits and vegetables.  Include lean proteins, such as chicken, [place], and fish. Lifestyle Each week, aim to do one of the following:  150 minutes of moderate exercise, such as walking.  75 minutes of vigorous exercise, such as running. General information  Know how many calories are in the foods you eat most often. This will help you calculate calorie counts faster.  Find a way of tracking calories that works for you. Get creative. Try different apps or programs if writing down calories does not work for you. What foods should I eat?  Eat nutritious foods. It is better to have a nutritious,  high-calorie food, such as an avocado, than a food with few nutrients, such as a bag of potato chips.  Use your calories on foods and drinks that will fill you up and will not leave you hungry soon after eating. ? Examples of foods that fill you up are nuts and nut butters, vegetables, lean proteins, and high-fiber foods such as whole grains. High-fiber foods are foods with more than 5 g of fiber per serving.  Pay attention to calories in drinks. Low-calorie drinks include water and unsweetened drinks. The items listed above may not be a complete list of foods and beverages you can eat.  Contact a dietitian for more information.   What foods should I limit? Limit foods or drinks that are not good sources of vitamins, minerals, or protein or that are high in unhealthy fats. These include:  Candy.  Other sweets.  Sodas, specialty coffee drinks, alcohol, and juice. The items listed above may not be a complete list of foods and beverages you should avoid. Contact a dietitian for more information. How do I count calories when eating out?  Pay attention to portions. Often, portions are much larger when eating out. Try these tips to keep portions smaller: ? Consider sharing a meal instead of getting your own. ? If you get your own meal, eat only half of it. Before you start eating, ask for a container and put half of your meal into it. ? When available, consider ordering smaller portions from the menu instead of full portions.  Pay attention to your food and drink choices. Knowing the way food is cooked and what is included with the meal can help you eat fewer calories. ? If calories are listed on the menu, choose the lower-calorie options. ? Choose dishes that include vegetables, fruits, whole grains, low-fat dairy products, and lean proteins. ? Choose items that are boiled, broiled, grilled, or steamed. Avoid items that are buttered, battered, fried, or served with cream sauce. Items labeled as  crispy are usually fried, unless stated otherwise. ? Choose water, low-fat milk, unsweetened iced tea, or other drinks without added sugar. If you want an alcoholic beverage, choose a lower-calorie option, such as a glass of wine or light beer. ? Ask for dressings, sauces, and syrups on the side. These are usually high in calories, so you should limit the amount you eat. ? If you want a salad, choose a garden salad and ask for grilled meats. Avoid extra toppings such as bacon, cheese, or fried items. Ask for the dressing on the side, or ask for olive oil and vinegar or lemon to use as dressing.  Estimate how many servings of a food you are given. Knowing serving sizes will help you be aware of how much food you are eating at restaurants. Where to find more information  Centers for Disease Control and Prevention: http://www.wolf.info/  [place] Department of Agriculture: http://www.wilson-mendoza.org/ Summary  Calorie counting means keeping track of how many calories you eat and drink each day. If you eat fewer calories than your body needs, you should lose weight.  A healthy amount of weight to lose per week is usually 1-2 lb (0.5-0.9 kg). This usually means reducing your daily calorie intake by 500-750 calories.  The number of calories in a food can be found on a Nutrition Facts label. If a food does not have a Nutrition Facts label, try to look up the calories online or ask your dietitian for help.  Use smaller plates, glasses, and bowls for smaller portions and to prevent overeating.  Use your calories on foods and drinks that will fill you up and not leave you hungry shortly after a meal. This information is not intended to replace advice given to you by your health care provider. Make sure you discuss any questions you have with your health care provider. Document Revised: 02/15/2019 Document Reviewed: 02/15/2019 Elsevier Patient Education  2021 [employer].

## 2020-05-27 ENCOUNTER — Other Ambulatory Visit: Payer: Self-pay | Admitting: Family

## 2020-05-27 DIAGNOSIS — E8881 Metabolic syndrome: Secondary | ICD-10-CM

## 2020-05-27 DIAGNOSIS — E669 Obesity, unspecified: Secondary | ICD-10-CM

## 2020-05-27 LAB — CMP14+EGFR
ALT: 17 IU/L (ref 0–32)
AST: 16 IU/L (ref 0–40)
Albumin/Globulin Ratio: 1.7 (ref 1.2–2.2)
Albumin: 4.8 g/dL (ref 3.8–4.8)
Alkaline Phosphatase: 112 IU/L (ref 44–121)
BUN/Creatinine Ratio: 14 (ref 9–23)
BUN: 9 mg/dL (ref 6–24)
Bilirubin Total: 0.5 mg/dL (ref 0.0–1.2)
CO2: 23 mmol/L (ref 20–29)
Calcium: 9.7 mg/dL (ref 8.7–10.2)
Chloride: 100 mmol/L (ref 96–106)
Creatinine, Ser: 0.66 mg/dL (ref 0.57–1.00)
Globulin, Total: 2.9 g/dL (ref 1.5–4.5)
Glucose: 84 mg/dL (ref 65–99)
Potassium: 4.1 mmol/L (ref 3.5–5.2)
Sodium: 137 mmol/L (ref 134–144)
Total Protein: 7.7 g/dL (ref 6.0–8.5)
eGFR: 108 mL/min/{1.73_m2} (ref >59)

## 2020-05-27 LAB — CBC WITH DIFFERENTIAL/PLATELET
Basophils Absolute: 0.1 10*3/uL (ref 0.0–0.2)
Basos: 1 %
EOS (ABSOLUTE): 0.2 10*3/uL (ref 0.0–0.4)
Eos: 3 %
Hematocrit: 42.1 % (ref 34.0–46.6)
Hemoglobin: 14.1 g/dL (ref 11.1–15.9)
Immature Grans (Abs): 0 10*3/uL (ref 0.0–0.1)
Immature Granulocytes: 0 %
Lymphocytes Absolute: 2.3 10*3/uL (ref 0.7–3.1)
Lymphs: 27 %
MCH: 28 pg (ref 26.6–33.0)
MCHC: 33.5 g/dL (ref 31.5–35.7)
MCV: 84 fL (ref 79–97)
Monocytes Absolute: 0.6 10*3/uL (ref 0.1–0.9)
Monocytes: 7 %
Neutrophils Absolute: 5.1 10*3/uL (ref 1.4–7.0)
Neutrophils: 62 %
Platelets: 311 10*3/uL (ref 150–450)
RBC: 5.04 x10E6/uL (ref 3.77–5.28)
RDW: 13.1 % (ref 11.7–15.4)
WBC: 8.2 10*3/uL (ref 3.4–10.8)

## 2020-05-27 LAB — LIPID PANEL
Chol/HDL Ratio: 3.3 ratio (ref 0.0–4.4)
Cholesterol, Total: 210 mg/dL — ABNORMAL HIGH (ref 100–199)
HDL: 63 mg/dL (ref >39)
LDL Chol Calc (NIH): 134 mg/dL — ABNORMAL HIGH (ref 0–99)
Triglycerides: 73 mg/dL (ref 0–149)
VLDL Cholesterol Cal: 13 mg/dL (ref 5–40)

## 2020-05-27 LAB — TSH: TSH: 2.61 u[IU]/mL (ref 0.450–4.500)

## 2020-05-27 LAB — HEPATITIS C ANTIBODY: Hep C Virus Ab: <0.1 s/co ratio (ref 0.0–0.9)

## 2020-05-27 LAB — VITAMIN D 25 HYDROXY (VIT D DEFICIENCY, FRACTURES): Vit D, 25-Hydroxy: 17.1 ng/mL — ABNORMAL LOW (ref 30.0–100.0)

## 2020-05-27 MED ORDER — VITAMIN D (ERGOCALCIFEROL) 1.25 MG (50000 UNIT) PO CAPS: 50000.0000 [IU] | ORAL_CAPSULE | ORAL | 3 refills | Status: DC

## 2020-05-28 MED ORDER — OZEMPIC (0.25 OR 0.5 MG/DOSE) 2 MG/1.5ML ~~LOC~~ SOPN: PEN_INJECTOR | SUBCUTANEOUS | 3 refills | Status: AC

## 2020-05-28 NOTE — Addendum Note (Signed)
Addended by: Lottie Dawson D on: 05/28/2020 11:18 AM   Modules accepted: Orders

## 2020-05-28 NOTE — Progress Notes (Signed)
Called in 56-month supply to [phone] copay card provided

## 2020-06-04 ENCOUNTER — Encounter: Payer: Self-pay | Admitting: Family

## 2020-08-15 LAB — COLOGUARD: Cologuard: NEGATIVE

## 2020-10-17 ENCOUNTER — Other Ambulatory Visit: Payer: Self-pay | Admitting: Family

## 2020-11-03 ENCOUNTER — Ambulatory Visit (INDEPENDENT_AMBULATORY_CARE_PROVIDER_SITE_OTHER): Payer: BC Managed Care – PPO | Admitting: Family Medicine

## 2020-11-03 ENCOUNTER — Encounter: Payer: Self-pay | Admitting: Family Medicine

## 2020-11-03 VITALS — BP 129/92 | HR 74 | Temp 97.6°F

## 2020-11-03 DIAGNOSIS — U071 COVID-19: Secondary | ICD-10-CM | POA: Diagnosis not present

## 2020-11-03 MED ORDER — ALBUTEROL SULFATE HFA 108 (90 BASE) MCG/ACT IN AERS: 2.0000 | INHALATION_SPRAY | Freq: Four times a day (QID) | RESPIRATORY_TRACT | 1 refills | Status: DC | PRN

## 2020-11-03 MED ORDER — DEXAMETHASONE 6 MG PO TABS: 6.0000 mg | ORAL_TABLET | Freq: Every day | ORAL | 0 refills | Status: DC

## 2020-11-03 NOTE — Progress Notes (Signed)
Assessment & Plan:  1. COVID-19 Continue symptom management. Start Decadron and Albuterol. Educated on use of Albuterol. Chest x-ray not needed due to clear lung sounds. Advised if symptoms do not improve she can let me know and I will prescribe a z-pak.  - dexamethasone (DECADRON) 6 MG tablet; Take 1 tablet (6 mg total) by mouth daily.  Dispense: 5 tablet; Refill: 0 - albuterol (VENTOLIN HFA) 108 (90 Base) MCG/ACT inhaler; Inhale 2 puffs into the lungs every 6 (six) hours as needed.  Dispense: 18 g; Refill: 1   Follow up plan: Return if symptoms worsen or fail to improve.  Hendricks Limes, MSN, APRN, FNP-C [facility]  Subjective:   Patient ID: Katherine Castro, female    DOB: 1970/05/10, 50 y.o.   MRN: [id number]  HPI: AUBERY DATE is a 50 y.o. female presenting on 11/03/2020 for Chest Pain (Chest tightness/) and Cough (X 1 week . Tested positive for covid x 1 week )  Patient tested positive for COVID-19 1 week ago with an at home COVID test. Cough has remained. She is concerned about chest tightness. Also having diarrhea.    ROS: Negative unless specifically indicated above in HPI.   Relevant past medical history reviewed and updated as indicated.   Allergies and medications reviewed and updated.   Current Outpatient Medications:    levothyroxine (SYNTHROID) 75 MCG tablet, TAKE 1 TABLET BY MOUTH  DAILY, Disp: 90 tablet, Rfl: 1   Vitamin D, Ergocalciferol, (DRISDOL) 1.25 MG (50000 UNIT) CAPS capsule, Take 1 capsule (50,000 Units total) by mouth every 7 (seven) days., Disp: 12 capsule, Rfl: 3  Allergies  Allergen Reactions   Bee Venom Swelling    Cellulitis around site   Penicillins Rash    Has patient had a PCN reaction causing immediate rash, facial/tongue/throat swelling, SOB or lightheadedness with hypotension: yes Has patient had a PCN reaction causing severe rash involving mucus membranes or skin necrosis: no Has patient had a PCN reaction  that required hospitalization: no Has patient had a PCN reaction occurring within the last 10 years:  no If all of the above answers are "NO", then may proceed with Cephalosporin use.     Objective:   BP (!) 129/92   Pulse 74   Temp 97.6 F (36.4 C) (Temporal)   SpO2 97%    Physical Exam Vitals reviewed.  Constitutional:      General: She is not in acute distress.    Appearance: Normal appearance. She is not ill-appearing, toxic-appearing or diaphoretic.  HENT:     Head: Normocephalic and atraumatic.  Eyes:     General: No scleral icterus.       Right eye: No discharge.        Left eye: No discharge.     Conjunctiva/sclera: Conjunctivae normal.  Cardiovascular:     Rate and Rhythm: Normal rate and regular rhythm.     Heart sounds: Normal heart sounds. No murmur heard.   No friction rub. No gallop.  Pulmonary:     Effort: Pulmonary effort is normal. No respiratory distress.     Breath sounds: Normal breath sounds. No stridor. No wheezing, rhonchi or rales.  Musculoskeletal:        General: Normal range of motion.     Cervical back: Normal range of motion.  Skin:    General: Skin is warm and dry.     Capillary Refill: Capillary refill takes less than 2 seconds.  Neurological:  General: No focal deficit present.     Mental Status: She is alert and oriented to person, place, and time. Mental status is at baseline.  Psychiatric:        Mood and Affect: Mood normal.        Behavior: Behavior normal.        Thought Content: Thought content normal.        Judgment: Judgment normal.

## 2021-03-06 ENCOUNTER — Encounter: Payer: Self-pay | Admitting: Family

## 2021-03-06 ENCOUNTER — Ambulatory Visit: Payer: BC Managed Care – PPO | Admitting: Family

## 2021-03-06 VITALS — BP 129/88 | HR 62 | Temp 98.0°F | Ht 61.0 in | Wt 170.8 lb

## 2021-03-06 DIAGNOSIS — R079 Chest pain, unspecified: Secondary | ICD-10-CM | POA: Diagnosis not present

## 2021-03-06 DIAGNOSIS — R0602 Shortness of breath: Secondary | ICD-10-CM | POA: Diagnosis not present

## 2021-03-06 NOTE — Patient Instructions (Signed)
Nonspecific Chest Pain, Adult Chest pain is an uncomfortable, tight, or painful feeling in the chest. The pain can feel like a crushing, aching, or squeezing pressure. A person can feel a burning or tingling sensation. Chest pain can also be felt in your back, neck, jaw, shoulder, or arm. This pain can be worse when you move, sneeze, or take a deep breath. Chest pain can be caused by a condition that is life-threatening. This must be treated right away. It can also be caused by something that is not life-threatening. If you have chest pain, it can be hard to know the difference, so it is important to get help right away to make sure that you do not have a serious condition. Some life-threatening causes of chest pain include: Heart attack. A tear in the body's main blood vessel (aortic dissection). Inflammation around your heart (pericarditis). A problem in the lungs, such as a blood clot (pulmonary embolism) or a collapsed lung (pneumothorax). Some non life-threatening causes of chest pain include: Heartburn. Anxiety or stress. Damage to the bones, muscles, and cartilage that make up your chest wall. Pneumonia or bronchitis. Shingles infection (varicella-zoster virus). Your chest pain may come and go. It may also be constant. Your health care provider will do tests and other studies to find the cause of your pain. Treatment will depend on the cause of your chest pain. Follow these instructions at home: Medicines Take over-the-counter and prescription medicines only as told by your health care provider. If you were prescribed an antibiotic medicine, take it as told by your health care provider. Do not stop taking the antibiotic even if you start to feel better. Activity Avoid any activities that cause chest pain. Do not lift anything that is heavier than 10 lb (4.5 kg), or the limit that you are told, until your health care provider says that it is safe. Rest as directed by your health care  provider. Return to your normal activities only as told by your health care provider. Ask your health care provider what activities are safe for you. Lifestyle   Do not use any products that contain nicotine or tobacco, such as cigarettes, e-cigarettes, and chewing tobacco. If you need help quitting, ask your health care provider. Do not drink alcohol. Make healthy lifestyle changes as recommended. These may include: Getting regular exercise. Ask your health care provider to suggest some exercises that are safe for you. Eating a heart-healthy diet. This includes plenty of fresh fruits and vegetables, whole grains, low-fat (lean) protein, and low-fat dairy products. A dietitian can help you find healthy eating options. Maintaining a healthy weight. Managing any other health conditions you may have, such as high blood pressure (hypertension) or diabetes. Reducing stress, such as with yoga or relaxation techniques. General instructions Pay attention to any changes in your symptoms. It is up to you to get the results of any tests that were done. Ask your health care provider, or the department that is doing the tests, when your results will be ready. Keep all follow-up visits as told by your health care provider. This is important. You may be asked to go for further testing if your chest pain does not go away. Contact a health care provider if: Your chest pain does not go away. You feel depressed. You have a fever. You notice changes in your symptoms or develop new symptoms. Get help right away if: Your chest pain gets worse. You have a cough that gets worse, or you cough up  blood. You have severe pain in your abdomen. You faint. You have sudden, unexplained chest discomfort. You have sudden, unexplained discomfort in your arms, back, neck, or jaw. You have shortness of breath at any time. You suddenly start to sweat, or your skin gets clammy. You feel nausea or you vomit. You suddenly  feel lightheaded or dizzy. You have severe weakness, or unexplained weakness or fatigue. Your heart begins to beat quickly, or it feels like it is skipping beats. These symptoms may represent a serious problem that is an emergency. Do not wait to see if the symptoms will go away. Get medical help right away. Call your local emergency services (911 in the [place] Do not drive yourself to the hospital. Summary Chest pain can be caused by a condition that is serious and requires urgent treatment. It may also be caused by something that is not life-threatening. Your health care provider may do lab tests and other studies to find the cause of your pain. Follow your health care provider's instructions on taking medicines, making lifestyle changes, and getting emergency treatment if symptoms become worse. Keep all follow-up visits as told by your health care provider. This includes visits for any further testing if your chest pain does not go away. This information is not intended to replace advice given to you by your health care provider. Make sure you discuss any questions you have with your health care provider. Document Revised: 03/20/2020 Document Reviewed: 03/20/2020 Elsevier Patient Education  [employer].

## 2021-03-06 NOTE — Progress Notes (Signed)
Subjective:    Patient ID: Katherine Castro, female    DOB: 12-12-1970, 51 y.o.   MRN: [id number]  Chief Complaint  Patient presents with   Chest Pain    Has SOB since COVID. Pain is still there was worse last night.   PT presents to the office today with chest pain that started last night gradually that was a 8 out 10, but has improved today.   She reports she has intermittent SOB that has happened since COVID in 10/2020.  Chest Pain  This is a new problem. The current episode started in the past 7 days. The onset quality is gradual. The problem occurs constantly. The problem has been waxing and waning. The pain is present in the substernal region. The pain is at a severity of 2/10. The pain is mild. The quality of the pain is described as heavy. The pain does not radiate. Pertinent negatives include no back pain, claudication, cough, exertional chest pressure, fever, headaches, irregular heartbeat, lower extremity edema, malaise/fatigue or nausea. The pain is aggravated by nothing. She has tried antacids and rest for the symptoms. The treatment provided mild relief.  Pertinent negatives for past medical history include no CAD and no MI.     Review of Systems  Constitutional:  Negative for fever and malaise/fatigue.  Respiratory:  Negative for cough.   Cardiovascular:  Positive for chest pain. Negative for claudication.  Gastrointestinal:  Negative for nausea.  Musculoskeletal:  Negative for back pain.  Neurological:  Negative for headaches.  All other systems reviewed and are negative.     Objective:   Physical Exam Vitals reviewed.  Constitutional:      General: She is not in acute distress.    Appearance: She is well-developed. She is obese.     Comments: No acute distress  HENT:     Head: Normocephalic and atraumatic.     Right Ear: External ear normal.  Eyes:     Pupils: Pupils are equal, round, and reactive to light.  Neck:     Thyroid: No thyromegaly.   Cardiovascular:     Rate and Rhythm: Normal rate and regular rhythm.     Heart sounds: Normal heart sounds. No murmur heard. Pulmonary:     Effort: Pulmonary effort is normal. No respiratory distress.     Breath sounds: Normal breath sounds. No wheezing.  Abdominal:     General: Bowel sounds are normal. There is no distension.     Palpations: Abdomen is soft.     Tenderness: There is no abdominal tenderness.  Musculoskeletal:        General: No tenderness. Normal range of motion.     Cervical back: Normal range of motion and neck supple.  Skin:    General: Skin is warm and dry.  Neurological:     Mental Status: She is alert and oriented to person, place, and time.     Cranial Nerves: No cranial nerve deficit.     Deep Tendon Reflexes: Reflexes are normal and symmetric.  Psychiatric:        Behavior: Behavior normal.        Thought Content: Thought content normal.        Judgment: Judgment normal.      BP 129/88    Pulse 62    Temp 98 F (36.7 C) (Temporal)    Ht 5' 1"  (1.549 m)    Wt 170 lb 12.8 oz (77.5 kg)    SpO2 100%  BMI 32.27 kg/m      Assessment & Plan:   NARMEEN KERPER comes in today with chief complaint of Chest Pain (Has SOB since COVID. Pain is still there was worse last night.)   Diagnosis and orders addressed:  1. Chest pain, unspecified type Given chest pain has continued will do referral to Cardiologists  If chest pain worsens go to ED, red flags discussed  Stress management  - EKG 12-Lead - Ambulatory referral to Cardiology - CMP14+EGFR - Anemia Profile [place], FNP

## 2021-03-08 LAB — ANEMIA PROFILE B
Basophils Absolute: 0 10*3/uL (ref 0.0–0.2)
Basos: 1 %
EOS (ABSOLUTE): 0.2 10*3/uL (ref 0.0–0.4)
Eos: 2 %
Ferritin: 87 ng/mL (ref 15–150)
Folate: 14 ng/mL (ref >3.0)
Hematocrit: 44.2 % (ref 34.0–46.6)
Hemoglobin: 14.6 g/dL (ref 11.1–15.9)
Immature Grans (Abs): 0 10*3/uL (ref 0.0–0.1)
Immature Granulocytes: 0 %
Iron Saturation: 15 % (ref 15–55)
Iron: 59 ug/dL (ref 27–159)
Lymphocytes Absolute: 1.7 10*3/uL (ref 0.7–3.1)
Lymphs: 25 %
MCH: 28.4 pg (ref 26.6–33.0)
MCHC: 33 g/dL (ref 31.5–35.7)
MCV: 86 fL (ref 79–97)
Monocytes Absolute: 0.5 10*3/uL (ref 0.1–0.9)
Monocytes: 8 %
Neutrophils Absolute: 4.5 10*3/uL (ref 1.4–7.0)
Neutrophils: 64 %
Platelets: 273 10*3/uL (ref 150–450)
RBC: 5.14 x10E6/uL (ref 3.77–5.28)
RDW: 13.6 % (ref 11.7–15.4)
Retic Ct Pct: 1.1 % (ref 0.6–2.6)
Total Iron Binding Capacity: 387 ug/dL (ref 250–450)
UIBC: 328 ug/dL (ref 131–425)
Vitamin B-12: 627 pg/mL (ref 232–1245)
WBC: 6.9 10*3/uL (ref 3.4–10.8)

## 2021-03-08 LAB — CMP14+EGFR
ALT: 25 IU/L (ref 0–32)
AST: 25 IU/L (ref 0–40)
Albumin/Globulin Ratio: 1.8 (ref 1.2–2.2)
Albumin: 5.6 g/dL — ABNORMAL HIGH (ref 3.8–4.8)
Alkaline Phosphatase: 114 IU/L (ref 44–121)
BUN/Creatinine Ratio: 17 (ref 9–23)
BUN: 13 mg/dL (ref 6–24)
Bilirubin Total: <0.2 mg/dL (ref 0.0–1.2)
CO2: 17 mmol/L — ABNORMAL LOW (ref 20–29)
Calcium: 11.6 mg/dL — ABNORMAL HIGH (ref 8.7–10.2)
Chloride: 115 mmol/L — ABNORMAL HIGH (ref 96–106)
Creatinine, Ser: 0.75 mg/dL (ref 0.57–1.00)
Globulin, Total: 3.2 g/dL (ref 1.5–4.5)
Glucose: 93 mg/dL (ref 70–99)
Potassium: 5.1 mmol/L (ref 3.5–5.2)
Sodium: 160 mmol/L — ABNORMAL HIGH (ref 134–144)
Total Protein: 8.8 g/dL — ABNORMAL HIGH (ref 6.0–8.5)
eGFR: 98 mL/min/{1.73_m2} (ref >59)

## 2021-03-10 ENCOUNTER — Other Ambulatory Visit: Payer: BC Managed Care – PPO

## 2021-03-10 ENCOUNTER — Other Ambulatory Visit: Payer: Self-pay | Admitting: Family

## 2021-03-10 ENCOUNTER — Ambulatory Visit (INDEPENDENT_AMBULATORY_CARE_PROVIDER_SITE_OTHER): Payer: BC Managed Care – PPO

## 2021-03-10 ENCOUNTER — Other Ambulatory Visit: Payer: Self-pay | Admitting: Family Medicine

## 2021-03-10 DIAGNOSIS — R079 Chest pain, unspecified: Secondary | ICD-10-CM

## 2021-03-10 DIAGNOSIS — E039 Hypothyroidism, unspecified: Secondary | ICD-10-CM

## 2021-03-10 DIAGNOSIS — R0789 Other chest pain: Secondary | ICD-10-CM | POA: Diagnosis not present

## 2021-03-11 LAB — BMP8+EGFR
BUN/Creatinine Ratio: 28 — ABNORMAL HIGH (ref 9–23)
BUN: 18 mg/dL (ref 6–24)
CO2: 24 mmol/L (ref 20–29)
Calcium: 10 mg/dL (ref 8.7–10.2)
Chloride: 106 mmol/L (ref 96–106)
Creatinine, Ser: 0.64 mg/dL (ref 0.57–1.00)
Glucose: 96 mg/dL (ref 70–99)
Potassium: 4.4 mmol/L (ref 3.5–5.2)
Sodium: 143 mmol/L (ref 134–144)
eGFR: 108 mL/min/{1.73_m2} (ref >59)

## 2021-03-11 LAB — THYROID PANEL WITH TSH
Free Thyroxine Index: 1.5 (ref 1.2–4.9)
T3 Uptake Ratio: 29 % (ref 24–39)
T4, Total: 5.3 ug/dL (ref 4.5–12.0)
TSH: 11.1 u[IU]/mL — ABNORMAL HIGH (ref 0.450–4.500)

## 2021-03-12 ENCOUNTER — Encounter: Payer: Self-pay | Admitting: Internal Medicine

## 2021-03-12 ENCOUNTER — Other Ambulatory Visit: Payer: Self-pay

## 2021-03-12 ENCOUNTER — Other Ambulatory Visit: Payer: Self-pay | Admitting: Family

## 2021-03-12 ENCOUNTER — Ambulatory Visit: Payer: BC Managed Care – PPO | Admitting: Internal Medicine

## 2021-03-12 VITALS — BP 130/84 | HR 63 | Ht 61.0 in | Wt 170.2 lb

## 2021-03-12 DIAGNOSIS — R079 Chest pain, unspecified: Secondary | ICD-10-CM

## 2021-03-12 MED ORDER — LEVOTHYROXINE SODIUM 100 MCG PO TABS: 100.0000 ug | ORAL_TABLET | Freq: Every day | ORAL | 1 refills | Status: DC

## 2021-03-12 NOTE — Progress Notes (Signed)
OFFICE CONSULT NOTE  Chief Complaint:  Chest pain  Primary Care Physician: Sharion Balloon, FNP  HPI:  Katherine Castro is a 51 y.o. female who is being seen today for the evaluation of chest pain at the request of Sharion Balloon, FNP. This is a pleasant 51 year old female kindly referred for evaluation of chest pain.  She reports several episodes of midsternal chest pain that was worse laying down and improved somewhat sitting upright.  She did not describe it as reflux or burning sensation however she denies any relationship to exertion or improvement with rest.  She says other than the positional changes sometimes is worse when taking a deeper breath.  She had had COVID-19 back in October however it seems unlikely it is related to that.  EKG performed at her PCPs office similar to today which shows normal sinus rhythm without ischemia.  She has few traditional cardiac risk factors including no history of hypertension, diabetes, smoking or other significant risks.  She does have a family history of MI in her father which was in his 19s.  She also has hypothyroidism on levothyroxine but otherwise takes no medicines.  PMHx:  Past Medical History:  Diagnosis Date   Thyroid disease     Past Surgical History:  Procedure Laterality Date   ABDOMINAL HYSTERECTOMY      FAMHx:  Family History  Problem Relation Age of Onset   Diabetes Father    Hypertension Father    Hyperlipidemia Father    Diabetes Paternal Grandmother    Hyperlipidemia Paternal Grandmother    Hypertension Paternal Grandmother     SOCHx:   reports that she has never smoked. She has never used smokeless tobacco. She reports that she does not drink alcohol and does not use drugs.  ALLERGIES:  Allergies  Allergen Reactions   Bee Venom Swelling    Cellulitis around site   Penicillins Rash    Has patient had a PCN reaction causing immediate rash, facial/tongue/throat swelling, SOB or lightheadedness with  hypotension: yes Has patient had a PCN reaction causing severe rash involving mucus membranes or skin necrosis: no Has patient had a PCN reaction that required hospitalization: no Has patient had a PCN reaction occurring within the last 10 years:  no If all of the above answers are "NO", then may proceed with Cephalosporin use.     ROS: Pertinent items noted in HPI and remainder of comprehensive ROS otherwise negative.  HOME MEDS: Current Outpatient Medications on File Prior to Visit  Medication Sig Dispense Refill   levothyroxine (SYNTHROID) 75 MCG tablet TAKE 1 TABLET BY MOUTH  DAILY 90 tablet 1   albuterol (VENTOLIN HFA) 108 (90 Base) MCG/ACT inhaler Inhale 2 puffs into the lungs every 6 (six) hours as needed. (Patient not taking: Reported on 03/06/2021) 18 g 1   dexamethasone (DECADRON) 6 MG tablet Take 1 tablet (6 mg total) by mouth daily. (Patient not taking: Reported on 03/06/2021) 5 tablet 0   Vitamin D, Ergocalciferol, (DRISDOL) 1.25 MG (50000 UNIT) CAPS capsule Take 1 capsule (50,000 Units total) by mouth every 7 (seven) days. (Patient not taking: Reported on 03/12/2021) 12 capsule 3   No current facility-administered medications on file prior to visit.    LABS/IMAGING: Results for orders placed or performed in visit on 03/10/21 (from the past 48 hour(s))  BMP8+EGFR     Status: Abnormal   Collection Time: 03/10/21  4:01 PM  Result Value Ref Range   Glucose 96 70 -  99 mg/dL   BUN 18 6 - 24 mg/dL   Creatinine, Ser 0.64 0.57 - 1.00 mg/dL   eGFR 108 >59 mL/min/1.73   BUN/Creatinine Ratio 28 (H) 9 - 23   Sodium 143 134 - 144 mmol/L   Potassium 4.4 3.5 - 5.2 mmol/L   Chloride 106 96 - 106 mmol/L   CO2 24 20 - 29 mmol/L   Calcium 10.0 8.7 - 10.2 mg/dL   DG Chest 2 View  Result Date: 03/11/2021 CLINICAL DATA:  Chest tightness and discomfort. EXAM: CHEST - 2 VIEW COMPARISON:  None. FINDINGS: The heart size and mediastinal contours are within normal limits. Both lungs are clear.  The visualized skeletal structures are unremarkable. IMPRESSION: No active cardiopulmonary disease. Electronically Signed   By: Marlaine Hind M.D.   On: 03/11/2021 08:37    LIPID PANEL:    Component Value Date/Time   CHOL 210 (H) 05/26/2020 1615   TRIG 73 05/26/2020 1615   HDL 63 05/26/2020 1615   CHOLHDL 3.3 05/26/2020 1615   LDLCALC 134 (H) 05/26/2020 1615    WEIGHTS: Wt Readings from Last 3 Encounters:  03/12/21 170 lb 3.2 oz (77.2 kg)  03/06/21 170 lb 12.8 oz (77.5 kg)  05/26/20 177 lb (80.3 kg)    VITALS: BP 130/84    Pulse 63    Ht 5' 1"  (1.549 m)    Wt 170 lb 3.2 oz (77.2 kg)    SpO2 99%    BMI 32.16 kg/m   EXAM: General appearance: alert and no distress Neck: no carotid bruit, no JVD, and thyroid not enlarged, symmetric, no tenderness/mass/nodules Lungs: clear to auscultation bilaterally Heart: regular rate and rhythm, S1, S2 normal, no murmur, click, rub or gallop Abdomen: soft, non-tender; bowel sounds normal; no masses,  no organomegaly Extremities: extremities normal, atraumatic, no cyanosis or edema Pulses: 2+ and symmetric Skin: Skin color, texture, turgor normal. No rashes or lesions Neurologic: Grossly normal Psych: Pleasant  EKG: Sinus rhythm 63- personally reviewed  ASSESSMENT: Chest pain  PLAN: 1.   Ms. Wojdyla is describing substernal chest pain which is somewhat positional, worse laying down and better sitting up however no recent viral prodrome, no EKG changes suggestive of any pericarditis.  Symptoms are somewhat sporadic and not consistent.  No worsening with exertion or improvement at rest.  She does have family history of early onset heart disease in her father but otherwise few traditional cardiac risk factors.  I think my pretest probability for coronary disease is low.  She is interested in starting an exercise routine.  We will go ahead and do an exercise treadmill stress test.  If this is negative then would be okay for her to start with more  intense exercise.  I did counsel her to work on cardiovascular risk reduction which she is doing including weight loss and lipid management given her father's early onset heart disease.  Follow-up with me as needed if her exercise stress test is abnormal.  Thanks again for the kind referral.  Pixie Casino, MD, [facility], [place] Director of the [facility] Lipid Disorders &  Cardiovascular Risk Reduction Clinic Diplomate of the American Board of Clinical Lipidology Attending Cardiologist  Direct Dial: [phone]   Fax: [phone]  Website:  [email or web address]  Nadean Corwin Rayelynn Loyal 03/12/2021, 1:21 PM

## 2021-03-12 NOTE — Patient Instructions (Signed)
Medication Instructions:  NO CHANGES  *If you need a refill on your cardiac medications before your next appointment, please call your pharmacy*  Testing/Procedures: Dr. Debara Pickett has ordered an Exercise Tolerance Test to be done at our office  Exercise Stress Test An exercise stress test is a test to check how your heart works during exercise. You will need to walk on a treadmill or ride an exercise bike for this test. An electrocardiogram (ECG) will record your heartbeat when you are at rest and when you are exercising. You may have an ultrasound or nuclear test after the exercise test. The test is done to check for coronary artery disease (CAD). It is also done to: See how well you can exercise. Watch for high blood pressure during exercise. Test how well you can exercise after treatment. Check the blood flow to your arms and legs. If your test result is not normal, more testing may be needed. What happens before the procedure? Follow instructions from your doctor about what you cannot eat or drink. Do not have any drinks or foods that have caffeine in them for 24 hours before the test, or as told by your doctor. This includes coffee, tea (even decaf tea), sodas, chocolate, and cocoa. Ask your doctor about changing or stopping your normal medicines. This is important if you: Take diabetes medicines. Take beta-blocker medicines. Wear a nitroglycerin patch. If you use an inhaler, bring it with you to the test. Do not put lotions, powders, creams, or oils on your chest before the test. Wear comfortable shoes and clothing. Do not use any products that have nicotine or tobacco in them, such as cigarettes and e-cigarettes. Stop using them at least 4 hours before the test. If you need help quitting, ask your doctor. What happens during the procedure? Patches (electrodes) will be put on your chest. Wires will be connected to the patches. The wires will send signals to a machine to record your  heartbeat. Your heart rate will be watched while you are resting and while you are exercising. Your blood pressure will also be watched during the test. You will walk on a treadmill or use an exercise bike. If you cannot use these, you may be asked to turn a crank with your hands. The activity will get harder and will raise your heart rate. You may be asked to breathe into a tube a few times during the test. This measures the gases that you breathe out. You will be asked how you are feeling throughout the test. You will exercise until your heart reaches a target heart rate. You will stop early if: You feel dizzy. You have chest pain. You are out of breath. Your blood pressure is too high or too low. You have an irregular heartbeat. You have pain or aching in your arms or legs. The procedure may vary among doctors and hospitals. What happens after the procedure? Your blood pressure, heart rate, breathing rate, and blood oxygen level will be watched after the test. You may return to your normal diet and activities as told by your doctor. It is up to you to get the results of your test. Ask your doctor, or the department that is doing the test, when your results will be ready. Summary An exercise stress test is a test to check how your heart works during exercise. This test is done to check for coronary artery disease. Your heart rate will be watched while you are resting and while you are exercising.  Follow instructions from your doctor about what you cannot eat or drink before the test. This information is not intended to replace advice given to you by your health care provider. Make sure you discuss any questions you have with your health care provider. Document Revised: 09/04/2019 Document Reviewed: 09/07/2019 Elsevier Patient Education  2022 [place]: At [facility], you and your health needs are our priority.  As part of our continuing mission to provide you  with exceptional heart care, we have created designated Provider Care Teams.  These Care Teams include your primary Cardiologist (physician) and Advanced Practice Providers (APPs -  Physician Assistants and Nurse Practitioners) who all work together to provide you with the care you need, when you need it.  We recommend signing up for the patient portal called "MyChart".  Sign up information is provided on this After Visit Summary.  MyChart is used to connect with patients for Virtual Visits (Telemedicine).  Patients are able to view lab/test results, encounter notes, upcoming appointments, etc.  Non-urgent messages can be sent to your provider as well.   To learn more about what you can do with MyChart, go to [email or web address].    Your next appointment:    AS NEEDED with Dr. Debara Pickett

## 2021-03-13 NOTE — Addendum Note (Signed)
Addended by: Fidel Levy on: 03/13/2021 10:21 AM   Modules accepted: Orders

## 2021-03-13 NOTE — Addendum Note (Signed)
Addended by: Pixie Casino on: 03/13/2021 10:32 AM   Modules accepted: Orders

## 2021-03-18 ENCOUNTER — Telehealth (HOSPITAL_COMMUNITY): Payer: Self-pay | Admitting: *Deleted

## 2021-03-18 NOTE — Telephone Encounter (Signed)
Close encounter 

## 2021-03-19 ENCOUNTER — Ambulatory Visit (HOSPITAL_COMMUNITY)
Admission: RE | Admit: 2021-03-19 | Discharge: 2021-03-19 | Disposition: A | Discharge status: Home / Self Care | Payer: BC Managed Care – PPO | Source: Ambulatory Visit | Attending: Cardiology | Admitting: Cardiology

## 2021-03-19 ENCOUNTER — Other Ambulatory Visit: Payer: Self-pay

## 2021-03-19 DIAGNOSIS — R079 Chest pain, unspecified: Secondary | ICD-10-CM | POA: Insufficient documentation

## 2021-03-19 LAB — EXERCISE TOLERANCE TEST
Estimated workload: 10.1
Exercise duration (min): 9 min
Exercise duration (sec): 0 s
MPHR: 171 {beats}/min
Peak HR: 181 {beats}/min
Percent HR: 105 %
Rest HR: 70 {beats}/min
ST Depression (mm): 0 mm

## 2021-04-02 ENCOUNTER — Other Ambulatory Visit: Payer: Self-pay | Admitting: Family

## 2022-01-01 ENCOUNTER — Ambulatory Visit: Payer: BC Managed Care – PPO | Admitting: Family Medicine

## 2022-01-01 ENCOUNTER — Encounter: Payer: Self-pay | Admitting: Family Medicine

## 2022-01-01 VITALS — BP 128/85 | HR 79 | Temp 98.1°F | Ht 61.0 in | Wt 157.8 lb

## 2022-01-01 DIAGNOSIS — J069 Acute upper respiratory infection, unspecified: Secondary | ICD-10-CM

## 2022-01-01 DIAGNOSIS — R051 Acute cough: Secondary | ICD-10-CM

## 2022-01-01 MED ORDER — FLUTICASONE PROPIONATE 50 MCG/ACT NA SUSP: 2.0000 | Freq: Every day | NASAL | 6 refills | Status: DC

## 2022-01-01 MED ORDER — PREDNISONE 20 MG PO TABS: ORAL_TABLET | ORAL | 0 refills | Status: DC

## 2022-01-01 NOTE — Progress Notes (Signed)
Subjective:  Patient ID: Katherine Castro, female    DOB: Aug 05, 1970, 51 y.o.   MRN: [id number]  Patient Care Team: Sharion Balloon, FNP as PCP - General (Family Medicine) Marylynn Pearson, MD as Consulting Physician (Obstetrics and Gynecology)   Chief Complaint:  Nasal Congestion, facial pressure, ear pressure, and Cough (X 5 days)   HPI: Katherine Castro is a 51 y.o. female presenting on 01/01/2022 for Nasal Congestion, facial pressure, ear pressure, and Cough (X 5 days)   Cough This is a new problem. Episode onset: 4-5 days. The problem has been waxing and waning. The problem occurs every few minutes. The cough is Non-productive. Associated symptoms include ear congestion, headaches, nasal congestion, postnasal drip, rhinorrhea and a sore throat. Pertinent negatives include no chest pain, chills, ear pain, fever, heartburn, hemoptysis, myalgias, rash, shortness of breath, sweats, weight loss or wheezing. Nothing aggravates the symptoms. She has tried rest for the symptoms. The treatment provided no relief.       Relevant past medical, surgical, family, and social history reviewed and updated as indicated.  Allergies and medications reviewed and updated. Data reviewed: Chart in Epic.   Past Medical History:  Diagnosis Date   Thyroid disease     Past Surgical History:  Procedure Laterality Date   ABDOMINAL HYSTERECTOMY      Social History   Socioeconomic History   Marital status: Married    Spouse name: Not on file   Number of children: Not on file   Years of education: Not on file   Highest education level: Not on file  Occupational History   Not on file  Tobacco Use   Smoking status: Never   Smokeless tobacco: Never  Vaping Use   Vaping Use: Never used  Substance and Sexual Activity   Alcohol use: No   Drug use: No   Sexual activity: Not on file  Other Topics Concern   Not on file  Social History Narrative   Not on file   Social Determinants of  Health   Financial Resource Strain: Not on file  Food Insecurity: Not on file  Transportation Needs: Not on file  Physical Activity: Not on file  Stress: Not on file  Social Connections: Not on file  Intimate Partner Violence: Not on file    Outpatient Encounter Medications as of 01/01/2022  Medication Sig   fluticasone (FLONASE) 50 MCG/ACT nasal spray Place 2 sprays into both nostrils daily.   levothyroxine (SYNTHROID) 75 MCG tablet TAKE 1 TABLET BY MOUTH  DAILY   predniSONE (DELTASONE) 20 MG tablet 2 po at sametime daily for 5 days- start tomorrow   levothyroxine (SYNTHROID) 100 MCG tablet Take 1 tablet (100 mcg total) by mouth daily. (Patient not taking: Reported on 01/01/2022)   Vitamin D, Ergocalciferol, (DRISDOL) 1.25 MG (50000 UNIT) CAPS capsule Take 1 capsule (50,000 Units total) by mouth every 7 (seven) days. (Patient not taking: Reported on 03/12/2021)   [DISCONTINUED] albuterol (VENTOLIN HFA) 108 (90 Base) MCG/ACT inhaler Inhale 2 puffs into the lungs every 6 (six) hours as needed. (Patient not taking: Reported on 03/06/2021)   [DISCONTINUED] dexamethasone (DECADRON) 6 MG tablet Take 1 tablet (6 mg total) by mouth daily. (Patient not taking: Reported on 03/06/2021)   No facility-administered encounter medications on file as of 01/01/2022.    Allergies  Allergen Reactions   Bee Venom Swelling and Other (See Comments)    Cellulitis around site   Penicillins Rash and Other (See Comments)  Has patient had a PCN reaction causing immediate rash, facial/tongue/throat swelling, SOB or lightheadedness with hypotension: yes  Has patient had a PCN reaction causing severe rash involving mucus membranes or skin necrosis: no  Has patient had a PCN reaction that required hospitalization: no  Has patient had a PCN reaction occurring within the last 10 years:  no  If all of the above answers are "NO", then may proceed with Cephalosporin use.    Review of Systems  Constitutional:   Positive for activity change. Negative for appetite change, chills, diaphoresis, fatigue, fever, unexpected weight change and weight loss.  HENT:  Positive for congestion, postnasal drip, rhinorrhea, sinus pressure and sore throat. Negative for dental problem, drooling, ear discharge, ear pain, facial swelling, hearing loss, mouth sores, nosebleeds, sinus pain, sneezing, tinnitus and trouble swallowing.   Eyes: Negative.  Negative for photophobia and visual disturbance.  Respiratory:  Positive for cough. Negative for apnea, hemoptysis, chest tightness, shortness of breath and wheezing.   Cardiovascular:  Negative for chest pain, palpitations and leg swelling.  Gastrointestinal:  Negative for abdominal pain, blood in stool, constipation, diarrhea, heartburn, nausea and vomiting.  Endocrine: Negative.  Negative for polydipsia, polyphagia and polyuria.  Genitourinary:  Negative for decreased urine volume, difficulty urinating, dysuria, frequency and urgency.  Musculoskeletal:  Negative for arthralgias and myalgias.  Skin: Negative.  Negative for rash.  Allergic/Immunologic: Negative.   Neurological:  Positive for headaches. Negative for dizziness, tremors, seizures, syncope, facial asymmetry, speech difficulty, weakness, light-headedness and numbness.  Hematological: Negative.   Psychiatric/Behavioral:  Negative for confusion, hallucinations, sleep disturbance and suicidal ideas.   All other systems reviewed and are negative.       Objective:  BP 128/85   Pulse 79   Temp 98.1 F (36.7 C) (Temporal)   Ht 5\' 1"  (1.549 m)   Wt 157 lb 12.8 oz (71.6 kg)   SpO2 97%   BMI 29.82 kg/m    Wt Readings from Last 3 Encounters:  01/01/22 157 lb 12.8 oz (71.6 kg)  03/12/21 170 lb 3.2 oz (77.2 kg)  03/06/21 170 lb 12.8 oz (77.5 kg)    Physical Exam Vitals and nursing note reviewed.  Constitutional:      General: She is not in acute distress.    Appearance: Normal appearance. She is  well-developed and well-groomed. She is not ill-appearing, toxic-appearing or diaphoretic.  HENT:     Head: Normocephalic and atraumatic.     Jaw: There is normal jaw occlusion.     Right Ear: Hearing normal. A middle ear effusion is present. Tympanic membrane is not erythematous.     Left Ear: Hearing normal. A middle ear effusion is present. Tympanic membrane is not erythematous.     Nose: Congestion present.     Mouth/Throat:     Lips: Pink.     Mouth: Mucous membranes are moist.     Pharynx: Oropharynx is clear. Uvula midline. Posterior oropharyngeal erythema present. No pharyngeal swelling, oropharyngeal exudate or uvula swelling.     Tonsils: No tonsillar exudate or tonsillar abscesses.  Eyes:     General: Lids are normal.     Extraocular Movements: Extraocular movements intact.     Conjunctiva/sclera: Conjunctivae normal.     Pupils: Pupils are equal, round, and reactive to light.  Neck:     Thyroid: No thyroid mass, thyromegaly or thyroid tenderness.     Vascular: No carotid bruit or JVD.     Trachea: Trachea and phonation normal.  Cardiovascular:  Rate and Rhythm: Normal rate and regular rhythm.     Chest Wall: PMI is not displaced.     Pulses: Normal pulses.     Heart sounds: Normal heart sounds. No murmur heard.    No friction rub. No gallop.  Pulmonary:     Effort: Pulmonary effort is normal. No respiratory distress.     Breath sounds: Normal breath sounds. No wheezing.  Abdominal:     General: Bowel sounds are normal. There is no distension or abdominal bruit.     Palpations: Abdomen is soft. There is no hepatomegaly or splenomegaly.     Tenderness: There is no abdominal tenderness. There is no right CVA tenderness or left CVA tenderness.     Hernia: No hernia is present.  Musculoskeletal:        General: Normal range of motion.     Cervical back: Normal range of motion and neck supple.     Right lower leg: No edema.     Left lower leg: No edema.   Lymphadenopathy:     Cervical: No cervical adenopathy.  Skin:    General: Skin is warm and dry.     Capillary Refill: Capillary refill takes less than 2 seconds.     Coloration: Skin is not cyanotic, jaundiced or pale.     Findings: No rash.  Neurological:     General: No focal deficit present.     Mental Status: She is alert and oriented to person, place, and time.     Sensory: Sensation is intact.     Motor: Motor function is intact.     Coordination: Coordination is intact.     Gait: Gait is intact.     Deep Tendon Reflexes: Reflexes are normal and symmetric.  Psychiatric:        Attention and Perception: Attention and perception normal.        Mood and Affect: Mood and affect normal.        Speech: Speech normal.        Behavior: Behavior normal. Behavior is cooperative.        Thought Content: Thought content normal.        Cognition and Memory: Cognition and memory normal.        Judgment: Judgment normal.     Results for orders placed or performed during the hospital encounter of 03/19/21  EXERCISE TOLERANCE TEST (ETT)  Result Value Ref Range   Rest HR 70.0 bpm   Rest BP 123/80 mmHg   Exercise duration (min) 9 min   Exercise duration (sec) 0 sec   Estimated workload 10.1    Peak HR 181 bpm   Peak BP 131/89 mmHg   MPHR 171 bpm   Percent HR 105.0 %   ST Depression (mm) 0 mm       Pertinent labs & imaging results that were available during my care of the patient were reviewed by me and considered in my medical decision making.  Assessment & Plan:  Alizah was seen today for nasal congestion, facial pressure, ear pressure and cough.  Diagnoses and all orders for this visit:  Acute cough URI with cough and congestion Testing pending. No indications of acute bacterial illness. Will start Flonase and prednisone as prescribed. Aware to report new, worsening, or persistent symptoms.  -     COVID-19, Flu A+B and RSV -     fluticasone (FLONASE) 50 MCG/ACT nasal  spray; Place 2 sprays into both nostrils daily. -  predniSONE (DELTASONE) 20 MG tablet; 2 po at sametime daily for 5 days- start tomorrow     Continue all other maintenance medications.  Follow up plan: Return if symptoms worsen or fail to improve.   Continue healthy lifestyle choices, including diet (rich in fruits, vegetables, and lean proteins, and low in salt and simple carbohydrates) and exercise (at least 30 minutes of moderate physical activity daily).  Educational handout given for URI  The above assessment and management plan was discussed with the patient. The patient verbalized understanding of and has agreed to the management plan. Patient is aware to call the clinic if they develop any new symptoms or if symptoms persist or worsen. Patient is aware when to return to the clinic for a follow-up visit. Patient educated on when it is appropriate to go to the emergency department.   Monia Pouch, FNP-C [facility] [phone]

## 2022-01-04 ENCOUNTER — Telehealth: Payer: Self-pay

## 2022-01-04 LAB — COVID-19, FLU A+B AND RSV
Influenza A, NAA: NOT DETECTED
Influenza B, NAA: NOT DETECTED
RSV, NAA: NOT DETECTED
SARS-CoV-2, NAA: NOT DETECTED

## 2022-01-04 MED ORDER — AZITHROMYCIN 250 MG PO TABS: ORAL_TABLET | ORAL | 0 refills | Status: DC

## 2022-01-04 NOTE — Addendum Note (Signed)
Addended by: Sonny Masters on: 01/04/2022 09:14 AM   Modules accepted: Orders

## 2022-01-04 NOTE — Telephone Encounter (Signed)
Patient states she is still sick and no better.  States you told her to let you know if she has not improved.  Would like to know if a zpack would work? If so please send to [facility] in [place].

## 2022-01-19 ENCOUNTER — Encounter: Payer: Self-pay | Admitting: Family

## 2022-01-27 ENCOUNTER — Encounter: Payer: Self-pay | Admitting: Family

## 2022-03-05 ENCOUNTER — Other Ambulatory Visit: Payer: Self-pay | Admitting: Family

## 2022-03-08 NOTE — Telephone Encounter (Signed)
Patient given 30 day supply and will need an appointment to be seen

## 2022-05-04 ENCOUNTER — Encounter: Payer: Self-pay | Admitting: Family Medicine

## 2022-07-16 ENCOUNTER — Encounter: Payer: Self-pay | Admitting: Family

## 2022-07-28 ENCOUNTER — Encounter: Payer: BC Managed Care – PPO | Admitting: Nurse Practitioner

## 2022-08-03 ENCOUNTER — Encounter: Payer: Self-pay | Admitting: Nurse Practitioner

## 2022-08-03 ENCOUNTER — Ambulatory Visit: Payer: BC Managed Care – PPO | Admitting: Nurse Practitioner

## 2022-08-03 VITALS — BP 133/86 | HR 65 | Temp 98.2°F | Ht 60.0 in | Wt 178.0 lb

## 2022-08-03 DIAGNOSIS — Z0289 Encounter for other administrative examinations: Secondary | ICD-10-CM

## 2022-08-03 DIAGNOSIS — Z6834 Body mass index (BMI) 34.0-34.9, adult: Secondary | ICD-10-CM | POA: Diagnosis not present

## 2022-08-03 DIAGNOSIS — E039 Hypothyroidism, unspecified: Secondary | ICD-10-CM | POA: Diagnosis not present

## 2022-08-03 DIAGNOSIS — E669 Obesity, unspecified: Secondary | ICD-10-CM | POA: Diagnosis not present

## 2022-08-03 NOTE — Progress Notes (Signed)
Office: [phone]  /  Fax: [phone]   Initial Visit  Katherine Castro was seen in clinic today to evaluate for obesity. She is interested in losing weight to improve overall health and reduce the risk of weight related complications. She presents today to review program treatment options, initial physical assessment, and evaluation.     She was referred by: Friend or Family  When asked what else they would like to accomplish? She states: Adopt healthier eating patterns, Improve quality of life, and Lose a target amount of weight : Goal weight:  135 lbs  Weight history:  She started gaining weight after having her second child.  Her weight has fluctuated over the years.  When asked how has your weight affected you? She states: Having fatigue and Having poor endurance  Some associated conditions: Hypothyroid, Vit d def  Contributing factors: Family history and Pregnancy  Weight promoting medications identified: None  Current nutrition plan: None  Current level of physical activity: None  Current or previous pharmacotherapy: Phentermine, Vit B12 injections, HCG injections,Ozempic  Response to medication: Lost weight initially but was unable to sustain weight loss   Past medical history includes:   Past Medical History:  Diagnosis Date   Thyroid disease      Objective:   BP 133/86   Pulse 65   Temp 98.2 F (36.8 C)   Ht 5' (1.524 m)   Wt 178 lb (80.7 kg)   SpO2 99%   BMI 34.76 kg/m  She was weighed on the bioimpedance scale: Body mass index is 34.76 kg/m.  Peak Weight:178 lbs , Body Fat%:46.8%, Visceral Fat Rating:12, Weight trend over the last 12 months: fluctuated   General:  Alert, oriented and cooperative. Patient is in no acute distress.  Respiratory: Normal respiratory effort, no problems with respiration noted   Gait: able to ambulate independently  Mental Status: Normal mood and affect. Normal behavior. Normal judgment and thought content.    DIAGNOSTIC DATA REVIEWED:  BMET    Component Value Date/Time   NA 143 03/10/2021 1601   K 4.4 03/10/2021 1601   CL 106 03/10/2021 1601   CO2 24 03/10/2021 1601   GLUCOSE 96 03/10/2021 1601   GLUCOSE 124 (H) 06/14/2016 0024   BUN 18 03/10/2021 1601   CREATININE 0.64 03/10/2021 1601   CALCIUM 10.0 03/10/2021 1601   GFRNONAA 105 09/13/2019 0859   GFRAA 121 09/13/2019 0859   No results found for: "HGBA1C" No results found for: "INSULIN" CBC    Component Value Date/Time   WBC 6.9 03/06/2021 1443   WBC 7.7 06/14/2016 0024   RBC 5.14 03/06/2021 1443   RBC 4.88 06/14/2016 0024   HGB 14.6 03/06/2021 1443   HCT 44.2 03/06/2021 1443   PLT 273 03/06/2021 1443   MCV 86 03/06/2021 1443   MCH 28.4 03/06/2021 1443   MCH 28.5 06/14/2016 0024   MCHC 33.0 03/06/2021 1443   MCHC 33.9 06/14/2016 0024   RDW 13.6 03/06/2021 1443   Iron/TIBC/Ferritin/ %Sat    Component Value Date/Time   IRON 59 03/06/2021 1443   TIBC 387 03/06/2021 1443   FERRITIN 87 03/06/2021 1443   IRONPCTSAT 15 03/06/2021 1443   Lipid Panel     Component Value Date/Time   CHOL 210 (H) 05/26/2020 1615   TRIG 73 05/26/2020 1615   HDL 63 05/26/2020 1615   CHOLHDL 3.3 05/26/2020 1615   LDLCALC 134 (H) 05/26/2020 1615   Hepatic Function Panel     Component Value Date/Time  PROT 8.8 (H) 03/06/2021 1443   ALBUMIN 5.6 (H) 03/06/2021 1443   AST 25 03/06/2021 1443   ALT 25 03/06/2021 1443   ALKPHOS 114 03/06/2021 1443   BILITOT <0.2 03/06/2021 1443      Component Value Date/Time   TSH 11.100 (H) 03/10/2021 1603     Assessment and Plan:   Acquired hypothyroidism Not currently on meds.  Will check labs at her next visit.    Generalized obesity  BMI 34.0-34.9,adult        Obesity Treatment / Action Plan:  Patient will work on garnering support from family and friends to begin weight loss journey. Will work on eliminating or reducing the presence of highly palatable, calorie dense foods in the  home. Will complete provided nutritional and psychosocial assessment questionnaire before the next appointment. Will be scheduled for indirect calorimetry to determine resting energy expenditure in a fasting state.  This will allow [place] to create a reduced calorie, high-protein meal plan to promote loss of fat mass while preserving muscle mass. Counseled on the health benefits of losing 5%-15% of total body weight. Was counseled on nutritional approaches to weight loss and benefits of reducing processed foods and consuming plant-based foods and high quality protein as part of nutritional weight management. Was counseled on pharmacotherapy and role as an adjunct in weight management.   Obesity Education Performed Today:  She was weighed on the bioimpedance scale and results were discussed and documented in the synopsis.  We discussed obesity as a disease and the importance of a more detailed evaluation of all the factors contributing to the disease.  We discussed the importance of long term lifestyle changes which include nutrition, exercise and behavioral modifications as well as the importance of customizing this to her specific health and social needs.  We discussed the benefits of reaching a healthier weight to alleviate the symptoms of existing conditions and reduce the risks of the biomechanical, metabolic and psychological effects of obesity.  Katherine Castro appears to be in the action stage of change and states they are ready to start intensive lifestyle modifications and behavioral modifications.  30 minutes was spent today on this visit including the above counseling, pre-visit chart review, and post-visit documentation.  Reviewed by clinician on day of visit: allergies, medications, problem list, medical history, surgical history, family history, social history, and previous encounter notes pertinent to obesity diagnosis.    Theodis Sato Basel Defalco FNP-C

## 2022-08-17 ENCOUNTER — Encounter: Payer: Self-pay | Admitting: Bariatrics

## 2022-08-17 ENCOUNTER — Ambulatory Visit: Payer: BC Managed Care – PPO | Admitting: Bariatrics

## 2022-08-17 VITALS — BP 129/87 | HR 65 | Temp 98.1°F | Ht 60.0 in | Wt 176.0 lb

## 2022-08-17 DIAGNOSIS — R5383 Other fatigue: Secondary | ICD-10-CM

## 2022-08-17 DIAGNOSIS — R0602 Shortness of breath: Secondary | ICD-10-CM | POA: Diagnosis not present

## 2022-08-17 DIAGNOSIS — E669 Obesity, unspecified: Secondary | ICD-10-CM | POA: Diagnosis not present

## 2022-08-17 DIAGNOSIS — E039 Hypothyroidism, unspecified: Secondary | ICD-10-CM | POA: Diagnosis not present

## 2022-08-17 DIAGNOSIS — Z0000 Encounter for general adult medical examination without abnormal findings: Secondary | ICD-10-CM | POA: Diagnosis not present

## 2022-08-17 DIAGNOSIS — E538 Deficiency of other specified B group vitamins: Secondary | ICD-10-CM | POA: Diagnosis not present

## 2022-08-17 DIAGNOSIS — Z6834 Body mass index (BMI) 34.0-34.9, adult: Secondary | ICD-10-CM

## 2022-08-17 DIAGNOSIS — Z1331 Encounter for screening for depression: Secondary | ICD-10-CM

## 2022-08-17 DIAGNOSIS — E559 Vitamin D deficiency, unspecified: Secondary | ICD-10-CM | POA: Diagnosis not present

## 2022-08-18 NOTE — Progress Notes (Signed)
Chief Complaint:   OBESITY Katherine Castro (MR# [id number]) is a 52 y.o. female who presents for evaluation and treatment of obesity and related comorbidities. Current BMI is Body mass index is 34.37 kg/m. Katherine Castro has been struggling with her weight for many years and has been unsuccessful in either losing weight, maintaining weight loss, or reaching her healthy weight goal.  Katherine Castro is currently in the action stage of change and ready to dedicate time achieving and maintaining a healthier weight. Katherine Castro is interested in becoming our patient and working on intensive lifestyle modifications including (but not limited to) diet and exercise for weight loss.  Patient is here for her initial visit. She had an information session on 08/03/2022 with Katherine Limbo, FNP.   Katherine Castro's habits were reviewed today and are as follows: Her family eats meals together, her desired weight loss is 41-46 lbs, she started gaining weight after her second child, her heaviest weight ever was 179 pounds, she is a picky eater and doesn't like to eat healthier foods, she has significant food cravings issues, she skips meals frequently, she is frequently drinking liquids with calories, and she frequently makes poor food choices.  Depression Screen Katherine Castro's Food and Mood (modified PHQ-9) score was 3.  Subjective:   1. Other fatigue Katherine Castro admits to daytime somnolence and admits to waking up still tired. Patient has a history of symptoms of daytime fatigue and morning fatigue. Katherine Castro generally gets 5 or 6 hours of sleep per night, and states that she has nightime awakenings. Snoring is not present. Apneic episodes are not present. Epworth Sleepiness Score is 2.   2. SOB (shortness of breath) on exertion Katherine Castro notes increasing shortness of breath with exercising and seems to be worsening over time with weight gain. She notes getting out of breath sooner with activity than she used to. This has not  gotten worse recently. Katherine Castro denies shortness of breath at rest or orthopnea.  3. Acquired hypothyroidism Patient is not taking Synthroid. She felt better (thyroid supplement).   4. Vitamin D deficiency Patient is not taking Vitamin D.   5. Health care maintenance Given obesity.   6. B12 deficiency Patient is not taking Vitamin B12.   Assessment/Plan:   1. Other fatigue Katherine Castro does feel that her weight is causing her energy to be lower than it should be. Fatigue may be related to obesity, depression or many other causes. Labs will be ordered, and in the meanwhile, Katherine Castro will focus on self care including making healthy food choices, increasing physical activity and focusing on stress reduction.  - EKG 12-Lead - CBC with Differential/Platelet - Comprehensive metabolic panel  2. SOB (shortness of breath) on exertion Katherine Castro does feel that she gets out of breath more easily that she used to when she exercises. Katherine Castro's shortness of breath appears to be obesity related and exercise induced. She has agreed to work on weight loss and gradually increase exercise to treat her exercise induced shortness of breath. Will continue to monitor closely.  3. Acquired hypothyroidism We will check labs today, and we will follow-up at patient's next visit.   - TSH+T4F+T3Free  4. Vitamin D deficiency We will check labs today, and we will follow-up at patient's next visit.   - VITAMIN D 25 Hydroxy (Vit-D Deficiency, Fractures)  5. Health care maintenance We will check labs today, and we will follow-up at her next visit. EKG and IC were done today and reviewed with the patient.   - Insulin,  random - Lipid Panel With LDL/HDL Ratio - Hemoglobin A1c - TSH+T4F+T3Free - VITAMIN D 25 Hydroxy (Vit-D Deficiency, Fractures) - CBC with Differential/Platelet - Comprehensive metabolic panel - Vitamin B12  6. B12 deficiency We will check labs today, and we will follow-up at patient's next visit.    - Vitamin B12  7. Depression screening Katherine Castro had a negative depression screening.   8. Generalized obesity - Insulin, random - Lipid Panel With LDL/HDL Ratio - Hemoglobin A1c - TSH+T4F+T3Free - VITAMIN D 25 Hydroxy (Vit-D Deficiency, Fractures) - CBC with Differential/Platelet - Comprehensive metabolic panel - Vitamin B12  9. Obesity (BMI 30-39.9) Katherine Castro is currently in the action stage of change and her goal is to continue with weight loss efforts. I recommend Katherine Castro begin the structured treatment plan as follows:  She has agreed to the Category 2 Plan.  Patient will not skip meals, and she is to increase water and increase protein.   Exercise goals: No exercise has been prescribed at this time.   Behavioral modification strategies: increasing lean protein intake, decreasing simple carbohydrates, increasing vegetables, increasing water intake, decreasing eating out, no skipping meals, meal planning and cooking strategies, keeping healthy foods in the home, and planning for success.  She was informed of the importance of frequent follow-up visits to maximize her success with intensive lifestyle modifications for her multiple health conditions. She was informed we would discuss her lab results at her next visit unless there is a critical issue that needs to be addressed sooner. Katherine Castro agreed to keep her next visit at the agreed upon time to discuss these results.  Objective:   Blood pressure 129/87, pulse 65, temperature 98.1 F (36.7 C), height 5' (1.524 m), weight 176 lb (79.8 kg), SpO2 98%. Body mass index is 34.37 kg/m.  EKG: Normal sinus rhythm, rate 65 BPM.  Indirect Calorimeter completed today shows a VO2 of 230 and a REE of 1584.  Her calculated basal metabolic rate is 1610 thus her basal metabolic rate is better than expected.  General: Cooperative, alert, well developed, in no acute distress. HEENT: Conjunctivae and lids unremarkable. Cardiovascular: Regular  rhythm.  Lungs: Normal work of breathing. Neurologic: No focal deficits.   Lab Results  Component Value Date   CREATININE 0.72 08/17/2022   BUN 10 08/17/2022   NA 139 08/17/2022   K 4.7 08/17/2022   CL 100 08/17/2022   CO2 24 08/17/2022   Lab Results  Component Value Date   ALT 29 08/17/2022   AST 26 08/17/2022   ALKPHOS 103 08/17/2022   BILITOT 0.5 08/17/2022   Lab Results  Component Value Date   HGBA1C 5.5 08/17/2022   Lab Results  Component Value Date   INSULIN 9.8 08/17/2022   Lab Results  Component Value Date   TSH 9.400 (H) 08/17/2022   Lab Results  Component Value Date   CHOL 218 (H) 08/17/2022   HDL 68 08/17/2022   LDLCALC 135 (H) 08/17/2022   TRIG 83 08/17/2022   CHOLHDL 3.3 05/26/2020   Lab Results  Component Value Date   WBC 6.0 08/17/2022   HGB 14.7 08/17/2022   HCT 44.5 08/17/2022   MCV 86 08/17/2022   PLT 311 08/17/2022   Lab Results  Component Value Date   IRON 59 03/06/2021   TIBC 387 03/06/2021   FERRITIN 87 03/06/2021   Attestation Statements:   Reviewed by clinician on day of visit: allergies, medications, problem list, medical history, surgical history, family history, social history, and previous encounter  notes.   I, Burt Knack, am acting as transcriptionist for [employer], DO.  I have reviewed the above documentation for accuracy and completeness, and I agree with the above. Corinna Capra, DO

## 2022-08-19 ENCOUNTER — Telehealth: Payer: Self-pay

## 2022-08-19 ENCOUNTER — Encounter (INDEPENDENT_AMBULATORY_CARE_PROVIDER_SITE_OTHER): Payer: Self-pay | Admitting: Bariatrics

## 2022-08-19 DIAGNOSIS — E7800 Pure hypercholesterolemia, unspecified: Secondary | ICD-10-CM | POA: Insufficient documentation

## 2022-08-19 NOTE — Telephone Encounter (Signed)
-----   Message from Corinna Capra sent at 08/19/2022  3:37 PM EDT ----- Regarding: high TAH Call pt. Her TSH is too high. She needs to go to a higher dose. Can I call in a prescription for her? ----- Message ----- From: Interface, [facility] Lab Results In Sent: 08/18/2022   5:39 AM EDT To: [place] Nickel, DO

## 2022-08-19 NOTE — Telephone Encounter (Signed)
Notified patient per Dr. Manson Passey to continue medication at current dosage and will discuss further at next visit.  Patient verbalized understanding.

## 2022-09-01 ENCOUNTER — Ambulatory Visit: Payer: BC Managed Care – PPO | Admitting: Bariatrics

## 2022-09-01 ENCOUNTER — Encounter: Payer: Self-pay | Admitting: Bariatrics

## 2022-09-01 VITALS — BP 121/83 | HR 59 | Temp 97.9°F | Ht 60.0 in | Wt 174.0 lb

## 2022-09-01 DIAGNOSIS — E7800 Pure hypercholesterolemia, unspecified: Secondary | ICD-10-CM

## 2022-09-01 DIAGNOSIS — E669 Obesity, unspecified: Secondary | ICD-10-CM | POA: Diagnosis not present

## 2022-09-01 DIAGNOSIS — E039 Hypothyroidism, unspecified: Secondary | ICD-10-CM

## 2022-09-01 DIAGNOSIS — E559 Vitamin D deficiency, unspecified: Secondary | ICD-10-CM | POA: Diagnosis not present

## 2022-09-01 DIAGNOSIS — Z6833 Body mass index (BMI) 33.0-33.9, adult: Secondary | ICD-10-CM

## 2022-09-01 MED ORDER — VITAMIN D (ERGOCALCIFEROL) 1.25 MG (50000 UNIT) PO CAPS: 50000.0000 [IU] | ORAL_CAPSULE | ORAL | 0 refills | Status: DC

## 2022-09-01 NOTE — Progress Notes (Deleted)
   WEIGHT SUMMARY AND BIOMETRICS  Weight Lost Since Last Visit: 2lb  Weight Gained Since Last Visit: 0lb   Vitals Temp: 97.9 F (36.6 C) BP: 121/83 Pulse Rate: (!) 59 SpO2: 100 %   Anthropometric Measurements Height: 5' (1.524 m) Weight: 174 lb (78.9 kg) BMI (Calculated): 33.98 Weight at Last Visit: 176lb Weight Lost Since Last Visit: 2lb Weight Gained Since Last Visit: 0lb Starting Weight: 176lb Total Weight Loss (lbs): 2 lb (0.907 kg)   Body Composition  Body Fat %: 44.5 % Fat Mass (lbs): 77.6 lbs Muscle Mass (lbs): 91.8 lbs Total Body Water (lbs): 64 lbs Visceral Fat Rating : 11   Other Clinical Data Fasting: Yes Labs: No Today's Visit #: 2 Starting Date: 08/17/22    OBESITY Zanetta is here to discuss her progress with her obesity treatment plan along with follow-up of her obesity related diagnoses.     Nutrition Plan: the Category 2 plan - 95% adherence.  Current exercise: none  Interim History:  *** {aabnutritionassessment:29213}  Pharmacotherapy: Teerica is on {dwwpharmacotherapy:29109} Adverse side effects: {dwwse:29122} Hunger is {EWCONTROLASSESSMENT:24261}.  Cravings are {EWCONTROLASSESSMENT:24261}.  Assessment/Plan:   There are no diagnoses linked to this encounter.    {dwwmorbid:29108::"Morbid Obesity"}: Current BMI BMI (Calculated): 33.98   Pharmacotherapy Plan {dwwmed:29123}  {dwwpharmacotherapy:29109}  Darnisha {CHL AMB IS/IS NOT:210130109} currently in the action stage of change. As such, her goal is to {MWMwtloss#1:210800005}.  She has agreed to {dwwsldiets:29085}.  Exercise goals: {MWM EXERCISE RECS:23473}  Behavioral modification strategies: {dwwslwtlossstrategies:29088}.  Denize has agreed to follow-up with our clinic in {NUMBER 1-10:22536} weeks.   No orders of the defined types were placed in this encounter.   There are no discontinued medications.   No orders of the defined types were placed in this  encounter.     Objective:   VITALS: Per patient if applicable, see vitals. GENERAL: Alert and in no acute distress. CARDIOPULMONARY: No increased WOB. Speaking in clear sentences.  PSYCH: Pleasant and cooperative. Speech normal rate and rhythm. Affect is appropriate. Insight and judgement are appropriate. Attention is focused, linear, and appropriate.  NEURO: Oriented as arrived to appointment on time with no prompting.   Attestation Statements:   ***(delete if time-based billing not used) Time spent on visit including the items listed below was *** minutes.  -preparing to see the patient (e.g., review of tests, history, previous notes) -obtaining and/or reviewing separately obtained history -counseling and educating the patient/family/caregiver -documenting clinical information in the electronic or other health record -ordering medications, tests, or procedures -independently interpreting results and communicating results to the patient/ family/caregiver -referring and communicating with other health care professionals  -care coordination   This was prepared with the assistance of Engineer, civil (consulting).  Occasional wrong-word or sound-a-like substitutions may have occurred due to the inherent limitations of voice recognition software.

## 2022-09-07 NOTE — Progress Notes (Signed)
Chief Complaint:   OBESITY Katherine Castro is here to discuss her progress with her obesity treatment plan along with follow-up of her obesity related diagnoses. Katherine Castro is on the Category 2 Plan and states she is following her eating plan approximately 95% of the time. Katherine Castro states she is doing 0 minutes 0 times per week.  Today's visit was #: 2 Starting weight: 176 lbs Starting date: 08/17/2022 Today's weight: 174 lbs Today's date: 09/01/2022 Total lbs lost to date: 2 Total lbs lost since last in-office visit: 2  Interim History: Patient is down 2 pounds since her first visit.  She states that Katherine Castro has been recalled.  Subjective:   1. Acquired hypothyroidism Patient was previously diagnosed.  She has stopped her medication.  Reviewed notes from 08/19/2022.  Last TSH was 9.400.  2. Vitamin D deficiency Patient's recent vitamin D level was 24.4.  3. Elevated cholesterol Patient is not on medications.  Assessment/Plan:   1. Acquired hypothyroidism Patient will resume her Synthroid medication at 75 mcg and we will recheck labs in the near future.  2. Vitamin D deficiency Patient agreed to start prescription vitamin D 50,000 IU once weekly with no refills.  - Vitamin D, Ergocalciferol, (DRISDOL) 1.25 MG (50000 UNIT) CAPS capsule; Take 1 capsule (50,000 Units total) by mouth every 7 (seven) days.  Dispense: 5 capsule; Refill: 0  3. Elevated cholesterol Patient will continue to work on eliminating trans fats and decrease saturated fats.  Unhealthy and unhealthy fats were discussed.  4. Generalized obesity  5. BMI 33.0-33.9,adult Katherine Castro is currently in the action stage of change. As such, her goal is to continue with weight loss efforts. She has agreed to the Category 2 Plan.   Meal planning was discussed. Patient will adhere to the plan closely.  Review labs with the patient from 08/17/2022, CMP, lipids, vitamin D, B12, CBC, A1c, insulin, and thyroid panel.  Salad and lunch  options were discussed and provided.  Exercise goals: No exercise has been prescribed at this time.  Behavioral modification strategies: increasing lean protein intake, decreasing simple carbohydrates, increasing vegetables, increasing water intake, decreasing eating out, no skipping meals, meal planning and cooking strategies, keeping healthy foods in the home, and planning for success.  Katherine Castro has agreed to follow-up with our clinic in 2 weeks. She was informed of the importance of frequent follow-up visits to maximize her success with intensive lifestyle modifications for her multiple health conditions.   Objective:   Blood pressure 121/83, pulse (!) 59, temperature 97.9 F (36.6 C), height 5' (1.524 m), weight 174 lb (78.9 kg), SpO2 100%. Body mass index is 33.98 kg/m.  General: Cooperative, alert, well developed, in no acute distress. HEENT: Conjunctivae and lids unremarkable. Cardiovascular: Regular rhythm.  Lungs: Normal work of breathing. Neurologic: No focal deficits.   Lab Results  Component Value Date   CREATININE 0.72 08/17/2022   BUN 10 08/17/2022   NA 139 08/17/2022   K 4.7 08/17/2022   CL 100 08/17/2022   CO2 24 08/17/2022   Lab Results  Component Value Date   ALT 29 08/17/2022   AST 26 08/17/2022   ALKPHOS 103 08/17/2022   BILITOT 0.5 08/17/2022   Lab Results  Component Value Date   HGBA1C 5.5 08/17/2022   Lab Results  Component Value Date   INSULIN 9.8 08/17/2022   Lab Results  Component Value Date   TSH 9.400 (H) 08/17/2022   Lab Results  Component Value Date   CHOL 218 (  H) 08/17/2022   HDL 68 08/17/2022   LDLCALC 135 (H) 08/17/2022   TRIG 83 08/17/2022   CHOLHDL 3.3 05/26/2020   Lab Results  Component Value Date   VD25OH 24.4 (L) 08/17/2022   VD25OH 17.1 (L) 05/26/2020   VD25OH 19.3 (L) 09/13/2019   Lab Results  Component Value Date   WBC 6.0 08/17/2022   HGB 14.7 08/17/2022   HCT 44.5 08/17/2022   MCV 86 08/17/2022   PLT 311  08/17/2022   Lab Results  Component Value Date   IRON 59 03/06/2021   TIBC 387 03/06/2021   FERRITIN 87 03/06/2021   Attestation Statements:   Reviewed by clinician on day of visit: allergies, medications, problem list, medical history, surgical history, family history, social history, and previous encounter notes.   Trude Mcburney, am acting as Energy manager for [employer], DO.  I have reviewed the above documentation for accuracy and completeness, and I agree with the above. Corinna Capra, DO

## 2022-09-13 ENCOUNTER — Encounter: Payer: Self-pay | Admitting: Bariatrics

## 2022-09-16 ENCOUNTER — Telehealth: Payer: Self-pay | Admitting: *Deleted

## 2022-09-16 ENCOUNTER — Other Ambulatory Visit: Payer: Self-pay | Admitting: Family Medicine

## 2022-09-16 DIAGNOSIS — J069 Acute upper respiratory infection, unspecified: Secondary | ICD-10-CM

## 2022-09-16 MED ORDER — AZITHROMYCIN 250 MG PO TABS: ORAL_TABLET | ORAL | 0 refills | Status: DC

## 2022-09-16 MED ORDER — FLUTICASONE PROPIONATE 50 MCG/ACT NA SUSP: 2.0000 | Freq: Every day | NASAL | 6 refills | Status: DC

## 2022-09-16 NOTE — Telephone Encounter (Signed)
Patient states that she has been having nasal congestion and cough x 3 weeks. Symptoms have improved slightly but still has a lot of nasal congestion.

## 2022-09-16 NOTE — Telephone Encounter (Signed)
Medications sent to pharmacy

## 2022-09-21 ENCOUNTER — Other Ambulatory Visit: Payer: Self-pay | Admitting: Bariatrics

## 2022-09-21 DIAGNOSIS — E559 Vitamin D deficiency, unspecified: Secondary | ICD-10-CM

## 2022-09-23 ENCOUNTER — Ambulatory Visit: Payer: BC Managed Care – PPO | Admitting: Nurse Practitioner

## 2022-10-07 ENCOUNTER — Ambulatory Visit: Payer: BC Managed Care – PPO | Admitting: Bariatrics

## 2022-11-03 ENCOUNTER — Telehealth: Payer: Self-pay

## 2022-11-03 NOTE — Telephone Encounter (Signed)
It is time for patient's lab work for her thyroid.  Does she need a visit or can she just come in for labs?

## 2022-11-03 NOTE — Telephone Encounter (Signed)
Will need a visit.

## 2022-11-03 NOTE — Telephone Encounter (Signed)
Patient aware, appointment scheduled.

## 2022-11-12 ENCOUNTER — Ambulatory Visit: Payer: BC Managed Care – PPO | Admitting: Family Medicine

## 2022-11-12 ENCOUNTER — Other Ambulatory Visit: Payer: BC Managed Care – PPO

## 2022-11-12 DIAGNOSIS — E039 Hypothyroidism, unspecified: Secondary | ICD-10-CM

## 2022-11-13 LAB — THYROID PANEL WITH TSH
Free Thyroxine Index: 2.7 (ref 1.2–4.9)
T3 Uptake Ratio: 32 % (ref 24–39)
T4, Total: 8.5 ug/dL (ref 4.5–12.0)
TSH: 0.106 u[IU]/mL — ABNORMAL LOW (ref 0.450–4.500)

## 2023-04-27 ENCOUNTER — Ambulatory Visit: Admitting: Family Medicine

## 2023-04-27 ENCOUNTER — Encounter: Payer: Self-pay | Admitting: Family Medicine

## 2023-04-27 VITALS — BP 131/89 | HR 78 | Temp 97.8°F | Ht 61.0 in | Wt 178.0 lb

## 2023-04-27 DIAGNOSIS — J0100 Acute maxillary sinusitis, unspecified: Secondary | ICD-10-CM

## 2023-04-27 MED ORDER — DOXYCYCLINE HYCLATE 100 MG PO TABS: 100.0000 mg | ORAL_TABLET | Freq: Two times a day (BID) | ORAL | 0 refills | Status: AC

## 2023-04-27 MED ORDER — CETIRIZINE HCL 10 MG PO TABS: 10.0000 mg | ORAL_TABLET | Freq: Every day | ORAL | 11 refills | Status: AC

## 2023-04-27 MED ORDER — FLUTICASONE PROPIONATE 50 MCG/ACT NA SUSP: 2.0000 | Freq: Every day | NASAL | 6 refills | Status: AC

## 2023-04-27 MED ORDER — CETIRIZINE HCL 10 MG PO TABS: 10.0000 mg | ORAL_TABLET | Freq: Every day | ORAL | 11 refills | Status: DC

## 2023-04-27 NOTE — Progress Notes (Signed)
 Subjective:  Patient ID: Katherine Castro, female    DOB: October 10, 1970, 53 y.o.   MRN: [id number]  Patient Care Team: Sonny Masters, FNP as PCP - General (Family Medicine) Zelphia Cairo, MD as Consulting Physician (Obstetrics and Gynecology)   Chief Complaint:  Cough, Nasal Congestion, and Headache (X 4-5 days )   HPI: Katherine Castro is a 53 y.o. female presenting on 04/27/2023 for Cough, Nasal Congestion, and Headache (X 4-5 days )   History of Present Illness   Katherine Castro is a 53 year old female who presents with facial pressure and sinus congestion.  She experiences significant facial pressure, particularly around her teeth, which are painful to touch. Last night was the worst, with symptoms including facial pressure and a sensation of fullness in her ears. Initially, she thought these symptoms were due to allergies, as she experienced a burning sensation in her nose when outside, but now suspects it may have developed into something more.  She mentions having a cough in the initial days, which she attributed to allergies, but the cough has since subsided. No fever, although she experienced sweating upon waking and was unable to find a thermometer to confirm her temperature. She also notes her hands are cold.  For symptom relief, she has been applying castor oil to her face, which she finds helpful. She has not been taking any regular medications for these symptoms but mentions using Flonase previously when she was sick. She is allergic to penicillins and prefers not to use antibiotics unless necessary.          Relevant past medical, surgical, family, and social history reviewed and updated as indicated.  Allergies and medications reviewed and updated. Data reviewed: Chart in Epic.   Past Medical History:  Diagnosis Date   IBS (irritable bowel syndrome)    Thyroid disease    Vitamin D deficiency     Past Surgical History:  Procedure Laterality Date    ABDOMINAL HYSTERECTOMY      Social History   Socioeconomic History   Marital status: Married    Spouse name: Not on file   Number of children: Not on file   Years of education: Not on file   Highest education level: Not on file  Occupational History   Not on file  Tobacco Use   Smoking status: Never   Smokeless tobacco: Never  Vaping Use   Vaping status: Never Used  Substance and Sexual Activity   Alcohol use: No   Drug use: No   Sexual activity: Not on file  Other Topics Concern   Not on file  Social History Narrative   Not on file   Social Drivers of Health   Financial Resource Strain: Not on file  Food Insecurity: Not on file  Transportation Needs: Not on file  Physical Activity: Not on file  Stress: Not on file  Social Connections: Not on file  Intimate Partner Violence: Not on file    Outpatient Encounter Medications as of 04/27/2023  Medication Sig   doxycycline (VIBRA-TABS) 100 MG tablet Take 1 tablet (100 mg total) by mouth 2 (two) times daily for 5 days. 1 po bid   fluticasone (FLONASE) 50 MCG/ACT nasal spray Place 2 sprays into both nostrils daily.   levothyroxine (SYNTHROID) 75 MCG tablet Take 1 tablet (75 mcg total) by mouth daily. Needs to be seen   [DISCONTINUED] cetirizine (ZYRTEC) 10 MG tablet Take 1 tablet (10 mg total) by mouth at  bedtime.   [DISCONTINUED] fluticasone (FLONASE) 50 MCG/ACT nasal spray Place 2 sprays into both nostrils daily.   cetirizine (ZYRTEC) 10 MG tablet Take 1 tablet (10 mg total) by mouth at bedtime.   levothyroxine (SYNTHROID) 100 MCG tablet Take 1 tablet (100 mcg total) by mouth daily. (Patient not taking: Reported on 01/01/2022)   Vitamin D, Ergocalciferol, (DRISDOL) 1.25 MG (50000 UNIT) CAPS capsule Take 1 capsule (50,000 Units total) by mouth every 7 (seven) days. (Patient not taking: Reported on 04/27/2023)   [DISCONTINUED] azithromycin (ZITHROMAX Z-PAK) 250 MG tablet As directed   No facility-administered encounter  medications on file as of 04/27/2023.    Allergies  Allergen Reactions   Bee Venom Swelling and Other (See Comments)    Cellulitis around site   Penicillins Rash and Other (See Comments)    Has patient had a PCN reaction causing immediate rash, facial/tongue/throat swelling, SOB or lightheadedness with hypotension: yes  Has patient had a PCN reaction causing severe rash involving mucus membranes or skin necrosis: no  Has patient had a PCN reaction that required hospitalization: no  Has patient had a PCN reaction occurring within the last 10 years:  no  If all of the above answers are "NO", then may proceed with Cephalosporin use.    Pertinent ROS per HPI, otherwise unremarkable      Objective:  BP 131/89   Pulse 78   Temp 97.8 F (36.6 C)   Ht 5\' 1"  (1.549 m)   Wt 178 lb (80.7 kg)   SpO2 93%   BMI 33.63 kg/m    Wt Readings from Last 3 Encounters:  04/27/23 178 lb (80.7 kg)  09/01/22 174 lb (78.9 kg)  08/17/22 176 lb (79.8 kg)    Physical Exam Vitals and nursing note reviewed.  Constitutional:      General: She is not in acute distress.    Appearance: Normal appearance. She is well-developed. She is obese. She is not ill-appearing or diaphoretic.  HENT:     Head: Normocephalic and atraumatic.     Right Ear: A middle ear effusion is present. Tympanic membrane is injected.     Left Ear: A middle ear effusion is present. Tympanic membrane is injected.     Nose: Congestion present.     Right Turbinates: Enlarged.     Left Turbinates: Enlarged.     Right Sinus: Maxillary sinus tenderness present. No frontal sinus tenderness.     Left Sinus: Maxillary sinus tenderness present. No frontal sinus tenderness.     Mouth/Throat:     Lips: Pink.     Mouth: Mucous membranes are moist.     Pharynx: Posterior oropharyngeal erythema and postnasal drip present. No pharyngeal swelling, oropharyngeal exudate or uvula swelling.  Eyes:     Extraocular Movements: Extraocular  movements intact.     Conjunctiva/sclera: Conjunctivae normal.     Pupils: Pupils are equal, round, and reactive to light.  Cardiovascular:     Rate and Rhythm: Normal rate and regular rhythm.     Heart sounds: Normal heart sounds.  Pulmonary:     Effort: Pulmonary effort is normal.     Breath sounds: Normal breath sounds.  Musculoskeletal:     Cervical back: Normal range of motion and neck supple.  Lymphadenopathy:     Cervical: Cervical adenopathy present.  Skin:    General: Skin is warm and dry.     Capillary Refill: Capillary refill takes less than 2 seconds.  Neurological:     General:  No focal deficit present.     Mental Status: She is alert and oriented to person, place, and time.  Psychiatric:        Mood and Affect: Mood normal.        Speech: Speech normal.        Behavior: Behavior normal.        Thought Content: Thought content normal.        Judgment: Judgment normal.     Results for orders placed or performed in visit on 11/12/22  Thyroid Panel With TSH   Collection Time: 11/12/22  9:26 AM  Result Value Ref Range   TSH 0.106 (L) 0.450 - 4.500 uIU/mL   T4, Total 8.5 4.5 - 12.0 ug/dL   T3 Uptake Ratio 32 24 - 39 %   Free Thyroxine Index 2.7 1.2 - 4.9       Pertinent labs & imaging results that were available during my care of the patient were reviewed by me and considered in my medical decision making.  Assessment & Plan:  Ann-Marie was seen today for cough, nasal congestion and headache.  Diagnoses and all orders for this visit:  Acute non-recurrent maxillary sinusitis -     doxycycline (VIBRA-TABS) 100 MG tablet; Take 1 tablet (100 mg total) by mouth 2 (two) times daily for 5 days. 1 po bid -     fluticasone (FLONASE) 50 MCG/ACT nasal spray; Place 2 sprays into both nostrils daily. -     Discontinue: cetirizine (ZYRTEC) 10 MG tablet; Take 1 tablet (10 mg total) by mouth at bedtime. -     cetirizine (ZYRTEC) 10 MG tablet; Take 1 tablet (10 mg total) by  mouth at bedtime.     Assessment and Plan    Acute Maxillary Sinusitis Symptoms include facial pressure, dental pain, ear fullness, and congestion, initially suspected as allergies but now suggestive of bacterial infection. Allergic to penicillins, hence doxycycline is selected. Concerned about contagion due to upcoming childcare responsibilities, informed non-contagious 24 hours post-antibiotic initiation. - Prescribe doxycycline for 5 days, twice daily. - Continue Flonase twice daily for one week, then once daily for another week. - Prescribe Zyrtec to be taken at night. - Reassure non-contagious status 24 hours after starting antibiotics.          Continue all other maintenance medications.  Follow up plan: Return if symptoms worsen or fail to improve.   Continue healthy lifestyle choices, including diet (rich in fruits, vegetables, and lean proteins, and low in salt and simple carbohydrates) and exercise (at least 30 minutes of moderate physical activity daily).  Educational handout given for sinusitis   The above assessment and management plan was discussed with the patient. The patient verbalized understanding of and has agreed to the management plan. Patient is aware to call the clinic if they develop any new symptoms or if symptoms persist or worsen. Patient is aware when to return to the clinic for a follow-up visit. Patient educated on when it is appropriate to go to the emergency department.   Kari Baars, FNP-C [facility] [phone]

## 2023-09-08 IMAGING — DX DG CHEST 2V
3 series · 3 of 3 positions shown · non-contrast
Comparison: None.

CLINICAL DATA: Chest tightness and discomfort.

EXAM:
CHEST - 2 VIEW

[chest pa (1 of 2)]
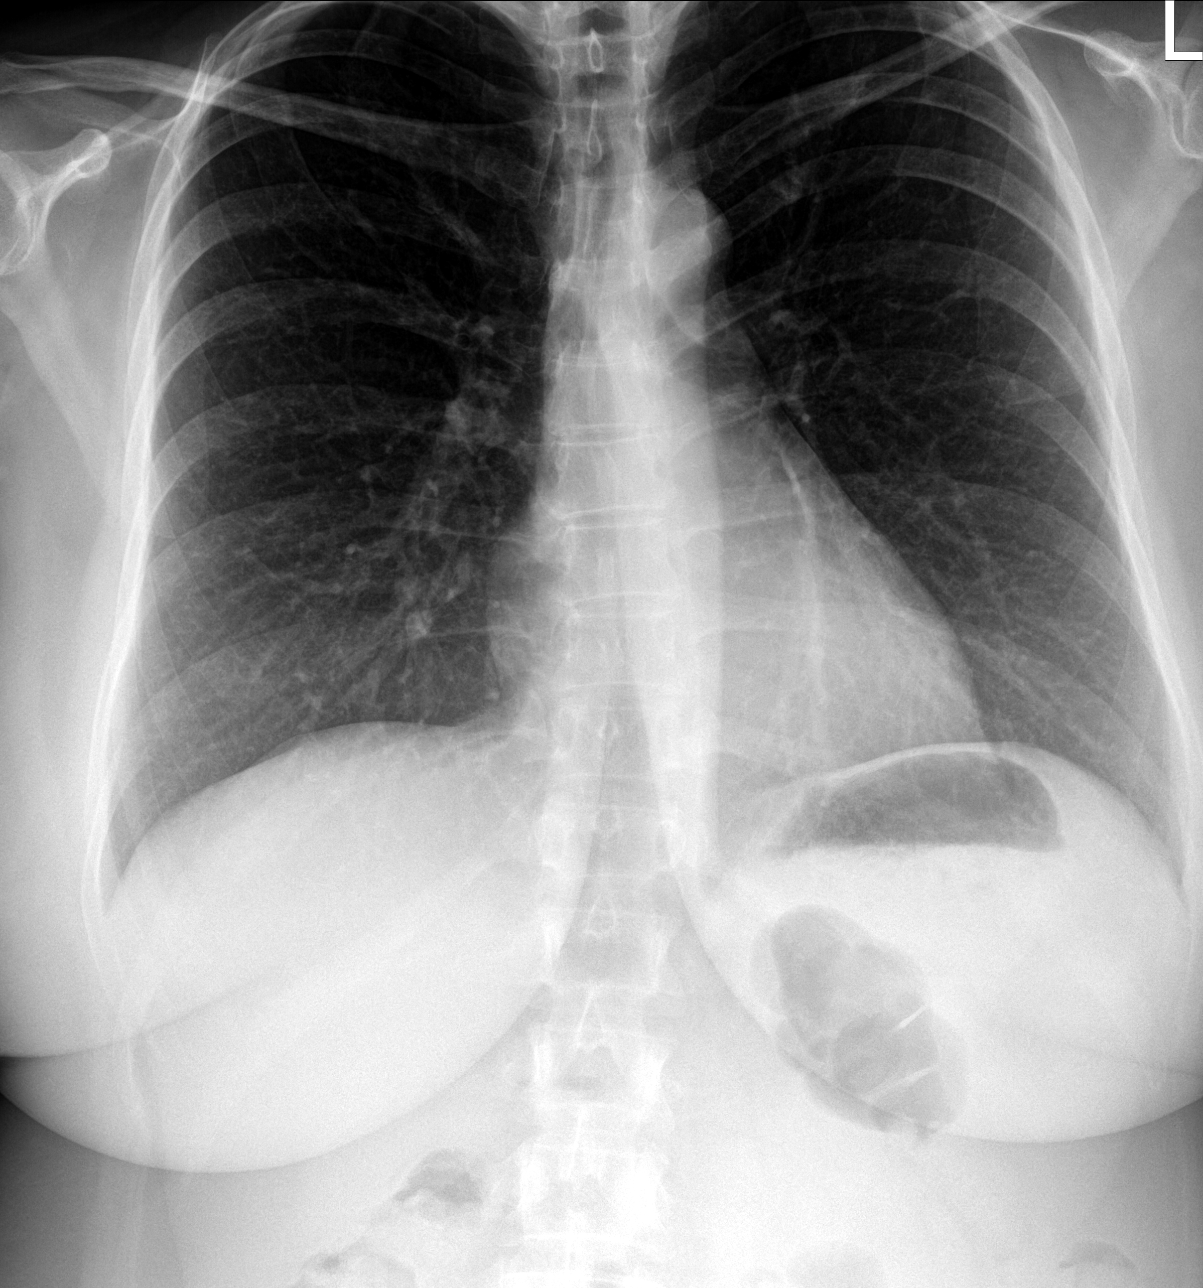

[chest lat]
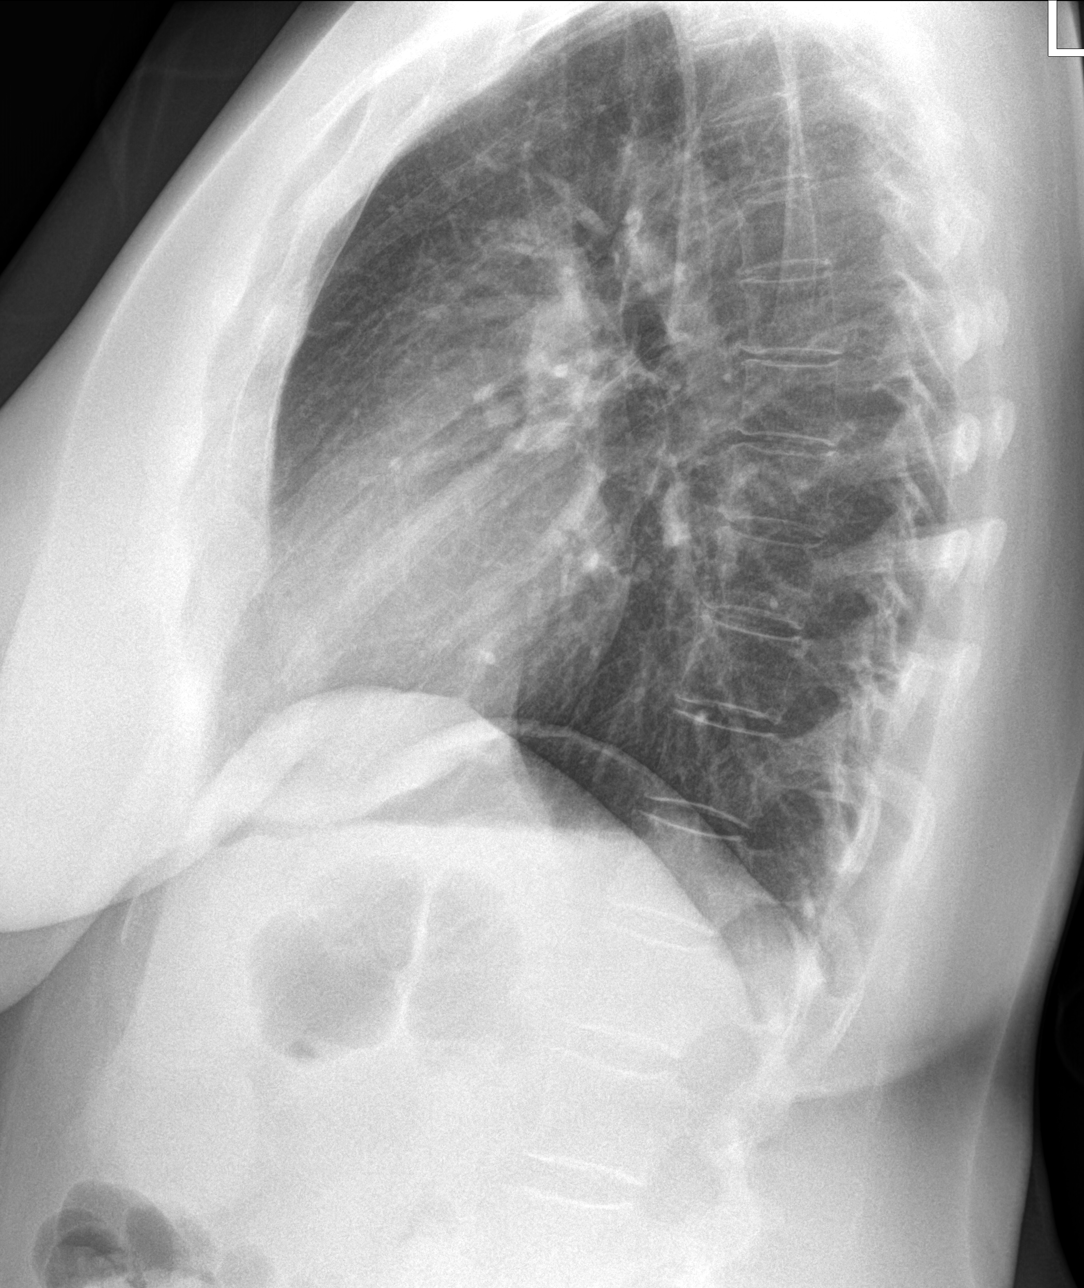

[chest pa (2 of 2)]
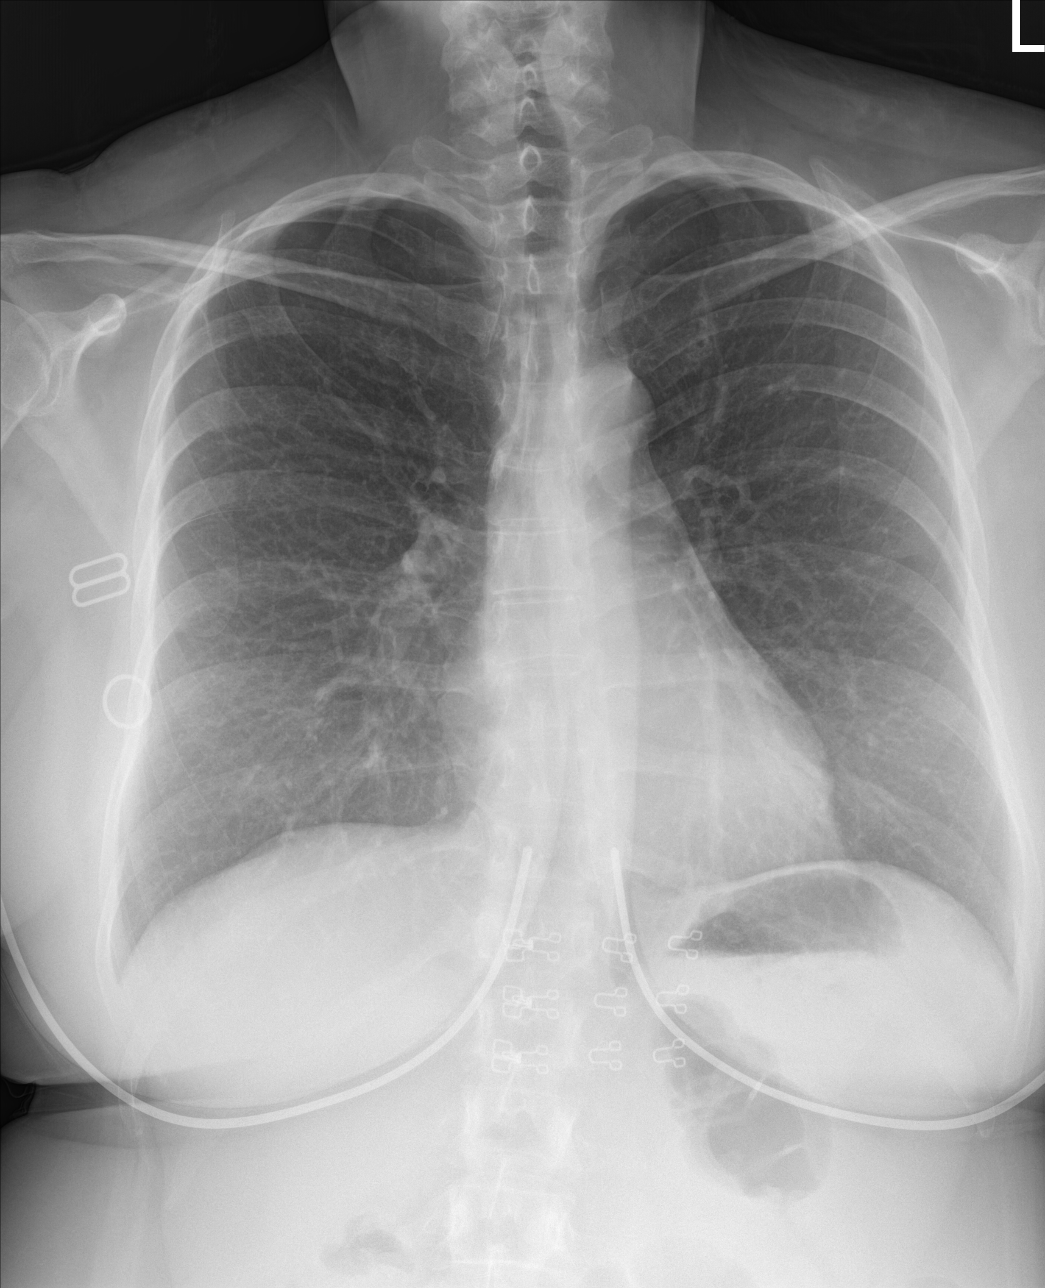

[3 of 3 positions shown; findings below may reference images not displayed]

FINDINGS: The heart size and mediastinal contours are within normal limits.
Both lungs are clear. The visualized skeletal structures are
unremarkable.
IMPRESSION: No active cardiopulmonary disease.

## 2023-11-24 ENCOUNTER — Telehealth: Payer: Self-pay | Admitting: *Deleted

## 2023-11-24 DIAGNOSIS — J069 Acute upper respiratory infection, unspecified: Secondary | ICD-10-CM

## 2023-11-24 MED ORDER — CHLORPHEN-PE-ACETAMINOPHEN 4-10-325 MG PO TABS: 1.0000 | ORAL_TABLET | Freq: Four times a day (QID) | ORAL | 0 refills | Status: DC | PRN

## 2023-11-24 MED ORDER — AZITHROMYCIN 500 MG PO TABS: 500.0000 mg | ORAL_TABLET | Freq: Every day | ORAL | 0 refills | Status: AC

## 2023-11-24 NOTE — Telephone Encounter (Signed)
 Cough, congestion, headache, sore throat, ear pain that started last Tuesday. Pt is requesting something be sent in.

## 2024-02-17 ENCOUNTER — Encounter: Payer: Self-pay | Admitting: Family Medicine

## 2024-02-17 ENCOUNTER — Telehealth: Payer: Self-pay | Admitting: Family Medicine

## 2024-02-17 ENCOUNTER — Telehealth (INDEPENDENT_AMBULATORY_CARE_PROVIDER_SITE_OTHER): Admitting: Family Medicine

## 2024-02-17 DIAGNOSIS — R058 Other specified cough: Secondary | ICD-10-CM

## 2024-02-17 DIAGNOSIS — J069 Acute upper respiratory infection, unspecified: Secondary | ICD-10-CM

## 2024-02-17 DIAGNOSIS — R0602 Shortness of breath: Secondary | ICD-10-CM

## 2024-02-17 MED ORDER — PREDNISONE 20 MG PO TABS: 40.0000 mg | ORAL_TABLET | Freq: Every day | ORAL | 0 refills | Status: AC

## 2024-02-17 MED ORDER — CHLORPHEN-PE-ACETAMINOPHEN 4-10-325 MG PO TABS: 1.0000 | ORAL_TABLET | Freq: Four times a day (QID) | ORAL | 0 refills | Status: AC | PRN

## 2024-02-17 MED ORDER — ALBUTEROL SULFATE HFA 108 (90 BASE) MCG/ACT IN AERS: 2.0000 | INHALATION_SPRAY | Freq: Four times a day (QID) | RESPIRATORY_TRACT | 0 refills | Status: AC | PRN

## 2024-02-17 MED ORDER — AZITHROMYCIN 500 MG PO TABS: 500.0000 mg | ORAL_TABLET | Freq: Every day | ORAL | 0 refills | Status: AC

## 2024-02-17 NOTE — Telephone Encounter (Signed)
 I tried to call pt about paying her copay for video o/v today & N/A-not able to [facility]

## 2024-02-17 NOTE — Progress Notes (Signed)
 "   Virtual Visit via Video   I connected with patient on 02/17/24 at 0805 by a video enabled telemedicine application and verified that I am speaking with the correct person using two identifiers.  Location patient: Home Location provider: [facility] Persons participating in the virtual visit: Patient and Provider  I discussed the limitations of evaluation and management by telemedicine and the availability of in person appointments. The patient expressed understanding and agreed to proceed.  Subjective:   HPI:  Pt presents today for  Chief Complaint  Patient presents with   URI   Katherine Castro is a 54 year old female who presents with cold symptoms and chest tightness.  She has been experiencing cold symptoms since Sunday or Monday, including a sore throat and ear discomfort. Initially, she did not have a fever and felt she was improving.  Last night, she experienced chest tightness and persistent coughing, which prompted her to sit in the living room. Her nasal discharge has changed from clear to yellow and green, and she feels drainage in her chest. She mentions that her chest feels better now, but last night she had difficulty breathing.  For symptom management, she has taken Tylenol  for her sore throat, extra vitamin C, and elderberry gummies. She might have an albuterol  inhaler from last year but has not used it recently.  The patient reports she has not had a fever.       ROS per HPI   Patient Active Problem List   Diagnosis Date Noted   Elevated cholesterol 08/19/2022   B12 deficiency 08/17/2022   Generalized obesity 08/17/2022   Health care maintenance 08/17/2022   SOB (shortness of breath) on exertion 08/17/2022   Other fatigue 08/17/2022   Obesity (BMI 30-39.9) 05/26/2020   Vitamin D  deficiency 06/15/2016   Acquired hypothyroidism 10/02/2015    Social History   Tobacco Use   Smoking status: Never   Smokeless tobacco:  Never  Substance Use Topics   Alcohol use: No   Current Medications[1]  Allergies[2]  Objective:   There were no vitals taken for this visit.  Patient is well-developed, well-nourished in no acute distress.  Resting comfortably at home.  Head is normocephalic, atraumatic.  No labored breathing.  Raspy voice, congested cough Patient is alert and oriented at baseline.   Assessment and Plan:   Hadasah was seen today for uri.  Diagnoses and all orders for this visit:  URI with cough and congestion -     azithromycin  (ZITHROMAX ) 500 MG tablet; Take 1 tablet (500 mg total) by mouth daily for 3 days. -     albuterol  (VENTOLIN  HFA) 108 (90 Base) MCG/ACT inhaler; Inhale 2 puffs into the lungs every 6 (six) hours as needed for wheezing or shortness of breath. -     predniSONE  (DELTASONE ) 20 MG tablet; Take 2 tablets (40 mg total) by mouth daily with breakfast for 5 days. -     Chlorphen-PE-Acetaminophen  4-10-325 MG TABS; Take 1 tablet by mouth every 6 (six) hours as needed.  Productive cough -     azithromycin  (ZITHROMAX ) 500 MG tablet; Take 1 tablet (500 mg total) by mouth daily for 3 days. -     albuterol  (VENTOLIN  HFA) 108 (90 Base) MCG/ACT inhaler; Inhale 2 puffs into the lungs every 6 (six) hours as needed for wheezing or shortness of breath. -     predniSONE  (DELTASONE ) 20 MG tablet; Take 2 tablets (40 mg total) by mouth daily with  breakfast for 5 days. -     Chlorphen-PE-Acetaminophen  4-10-325 MG TABS; Take 1 tablet by mouth every 6 (six) hours as needed.  Shortness of breath -     azithromycin  (ZITHROMAX ) 500 MG tablet; Take 1 tablet (500 mg total) by mouth daily for 3 days. -     albuterol  (VENTOLIN  HFA) 108 (90 Base) MCG/ACT inhaler; Inhale 2 puffs into the lungs every 6 (six) hours as needed for wheezing or shortness of breath. -     predniSONE  (DELTASONE ) 20 MG tablet; Take 2 tablets (40 mg total) by mouth daily with breakfast for 5 days. -     Chlorphen-PE-Acetaminophen   4-10-325 MG TABS; Take 1 tablet by mouth every 6 (six) hours as needed.       Acute upper respiratory infection Symptoms since Sunday or Monday, including sore throat, ear discomfort, and productive cough with yellow-green sputum. Recent chest tightness and difficulty breathing, likely due to drainage. No fever reported. Symptoms suggestive of bacterial superinfection given the change in sputum color. - Prescribed Mucinex for symptomatic relief. - Prescribed albuterol  inhaler for chest tightness and difficulty breathing. - Prescribed azithromycin  for upper respiratory infection. - Prescribed prednisone  for worsening symptoms over the weekend. - Advised to take Mucinex with plenty of water.       Return if symptoms worsen or fail to improve.  Rosaline Bruns, FNP-C [facility] [address] [place], [place] [address] [phone]  02/17/2024  Time spent with the patient: 11 minutes, of which >50% was spent in obtaining information about symptoms, reviewing previous labs, evaluations, and treatments, counseling about condition (please see the discussed topics above), and developing a plan to further investigate it; had a number of questions which I addressed.      [1]  Current Outpatient Medications:    albuterol  (VENTOLIN  HFA) 108 (90 Base) MCG/ACT inhaler, Inhale 2 puffs into the lungs every 6 (six) hours as needed for wheezing or shortness of breath., Disp: 8 g, Rfl: 0   azithromycin  (ZITHROMAX ) 500 MG tablet, Take 1 tablet (500 mg total) by mouth daily for 3 days., Disp: 3 tablet, Rfl: 0   predniSONE  (DELTASONE ) 20 MG tablet, Take 2 tablets (40 mg total) by mouth daily with breakfast for 5 days., Disp: 10 tablet, Rfl: 0   cetirizine  (ZYRTEC ) 10 MG tablet, Take 1 tablet (10 mg total) by mouth at bedtime., Disp: 90 tablet, Rfl: 11   Chlorphen-PE-Acetaminophen  4-10-325 MG TABS, Take 1 tablet by mouth every 6 (six) hours as needed., Disp: 60 tablet, Rfl: 0    fluticasone  (FLONASE ) 50 MCG/ACT nasal spray, Place 2 sprays into both nostrils daily., Disp: 16 g, Rfl: 6   levothyroxine  (SYNTHROID ) 75 MCG tablet, Take 1 tablet (75 mcg total) by mouth daily. Needs to be seen, Disp: 30 tablet, Rfl: 0 [2]  Allergies Allergen Reactions   Bee Venom Swelling and Other (See Comments)    Cellulitis around site   Penicillins Rash and Other (See Comments)    Has patient had a PCN reaction causing immediate rash, facial/tongue/throat swelling, SOB or lightheadedness with hypotension: yes  Has patient had a PCN reaction causing severe rash involving mucus membranes or skin necrosis: no  Has patient had a PCN reaction that required hospitalization: no  Has patient had a PCN reaction occurring within the last 10 years:  no  If all of the above answers are NO, then may proceed with Cephalosporin use.   "
